# Patient Record
Sex: Female | Born: 1985 | Race: White | Hispanic: No | Marital: Single | Smoking: Never smoker
Health system: Southern US, Community
[De-identification: ages and names within clinical notes are randomized; demographics above are authoritative.]

## PROBLEM LIST (undated history)

## (undated) DIAGNOSIS — Q909 Down syndrome, unspecified: Secondary | ICD-10-CM

## (undated) DIAGNOSIS — R011 Cardiac murmur, unspecified: Secondary | ICD-10-CM

## (undated) DIAGNOSIS — J42 Unspecified chronic bronchitis: Secondary | ICD-10-CM

## (undated) DIAGNOSIS — Z9889 Other specified postprocedural states: Secondary | ICD-10-CM

## (undated) DIAGNOSIS — J982 Interstitial emphysema: Secondary | ICD-10-CM

## (undated) DIAGNOSIS — E8809 Other disorders of plasma-protein metabolism, not elsewhere classified: Secondary | ICD-10-CM

## (undated) DIAGNOSIS — J189 Pneumonia, unspecified organism: Secondary | ICD-10-CM

## (undated) DIAGNOSIS — Z8489 Family history of other specified conditions: Secondary | ICD-10-CM

## (undated) DIAGNOSIS — F449 Dissociative and conversion disorder, unspecified: Secondary | ICD-10-CM

## (undated) DIAGNOSIS — R112 Nausea with vomiting, unspecified: Secondary | ICD-10-CM

## (undated) DIAGNOSIS — E039 Hypothyroidism, unspecified: Secondary | ICD-10-CM

## (undated) DIAGNOSIS — I2699 Other pulmonary embolism without acute cor pulmonale: Secondary | ICD-10-CM

## (undated) HISTORY — PX: TOE FUSION: SHX1070

## (undated) HISTORY — PX: EYE MUSCLE SURGERY: SHX370

## (undated) HISTORY — PX: BUNIONECTOMY: SHX129

## (undated) HISTORY — PX: TONSILLECTOMY AND ADENOIDECTOMY: SUR1326

## (undated) HISTORY — PX: MYRINGOPLASTY: SUR873

---

## 2014-11-25 ENCOUNTER — Ambulatory Visit (INDEPENDENT_AMBULATORY_CARE_PROVIDER_SITE_OTHER): Payer: BLUE CROSS/BLUE SHIELD | Admitting: Sports Medicine

## 2014-11-25 ENCOUNTER — Encounter: Payer: Self-pay | Admitting: Sports Medicine

## 2014-11-25 VITALS — BP 114/73 | Ht 59.0 in | Wt 150.0 lb

## 2014-11-25 DIAGNOSIS — R269 Unspecified abnormalities of gait and mobility: Secondary | ICD-10-CM | POA: Diagnosis not present

## 2014-11-25 DIAGNOSIS — M25579 Pain in unspecified ankle and joints of unspecified foot: Secondary | ICD-10-CM | POA: Diagnosis not present

## 2014-11-25 NOTE — Progress Notes (Signed)
  Angela Odom - 29 y.o. female MRN [id number]  Date of birth: February 19, 1986  SUBJECTIVE: CC:  1. Office visit for Custom Orthotic Fabrication  HPI:   Multiple prior foot issues including bunion formation with fusion of the first MTP on the left.  Multiple inversion injuries.  Seen at [facility], referred for orthotics  ROS: - per HPI   HISTORY:  Past Medical, Surgical, Social, and Family History reviewed & updated per EMR.  Pertinent Historical Findings include: Social History   Occupational History  . Student [employer]   Social History Main Topics  . Smoking status: Never Smoker   . Smokeless tobacco: Not on file  . Alcohol Use: Not on file  . Drug Use: Not on file  . Sexual Activity: Not on file     OBJECTIVE:  VS:   HT:$Re'4\' 11"'ZTM$  (149.9 cm)   WT:150 lb (68.04 kg)  BMI:30.4          BP:114/73 mmHg  HR: bpm  TEMP: ( )  RESP:   PHYSICAL EXAM: GENERAL:  adult, Caucasian, Down syndrome. No acute distress  PSYCH: Alert and appropriately interactive.  SKIN: No open skin lesions or abnormal skin markings on areas inspected as below  VASCULAR:  DP and PT pulses 2+/4. No significant pretibial edema.   NEURO:  sensation is intact bilateral feet   BILATERAL FEET: Multiple postsurgical incisions. Left first MTP is fused. Second with prior bunion osteotomy, [place]. Pes planus. Calcaneal valgus. Splay toe. Dorsiflexion limited to 80 and 85 GAIT: Wide-based, antalgic, heel striker, external rotation bilateral feet.  ASSESSMENT & PLAN: See problem based charting & AVS for additional documentation Problem List Items Addressed This Visit    Abnormality of gait    PROCEDURE NOTE : CUSTOM ORTHOTICS The patient was fitted for a standard, cushioned, semi-rigid orthotic. The orthotic was heated & placed on the orthotic stand. The patient was positioned in subtalar neutral position and 10 of ankle dorsiflexion and weight bearing stance some heated orthotic blank. After completion of  the molding a stable paste was applied to the orthotic blank. The orthotic was ground to a stable position for weightbearing. The patient ambulated in these and reported they were comfortable without pressure spots.  Blank:  Size 6 - Standard Cushioned  Base:  Blue EVA  Postings:  small met pads and 1st ray post  >50% of this 45 minute visit spent in direct patient counseling and/or coordination of care.        Other Visit Diagnoses    Pain in joint, ankle and foot, unspecified laterality    -  Primary      FOLLOW UP:  Return if symptoms worsen or fail to improve.

## 2014-11-26 ENCOUNTER — Encounter: Payer: Self-pay | Admitting: Sports Medicine

## 2014-11-26 DIAGNOSIS — R269 Unspecified abnormalities of gait and mobility: Secondary | ICD-10-CM | POA: Insufficient documentation

## 2014-11-26 NOTE — Assessment & Plan Note (Signed)
PROCEDURE NOTE : CUSTOM ORTHOTICS The patient was fitted for a standard, cushioned, semi-rigid orthotic. The orthotic was heated & placed on the orthotic stand. The patient was positioned in subtalar neutral position and 10 of ankle dorsiflexion and weight bearing stance some heated orthotic blank. After completion of the molding a stable paste was applied to the orthotic blank. The orthotic was ground to a stable position for weightbearing. The patient ambulated in these and reported they were comfortable without pressure spots.  Blank:  Size 6 - Standard Cushioned  Base:  Blue EVA  Postings:  small met pads and 1st ray post  >50% of this 45 minute visit spent in direct patient counseling and/or coordination of care.

## 2016-01-22 DIAGNOSIS — I2699 Other pulmonary embolism without acute cor pulmonale: Secondary | ICD-10-CM

## 2016-01-22 HISTORY — DX: Other pulmonary embolism without acute cor pulmonale: I26.99

## 2016-03-17 ENCOUNTER — Encounter: Payer: Self-pay | Admitting: Oncology

## 2016-04-06 ENCOUNTER — Telehealth: Payer: Self-pay | Admitting: Oncology

## 2016-04-06 ENCOUNTER — Ambulatory Visit (HOSPITAL_BASED_OUTPATIENT_CLINIC_OR_DEPARTMENT_OTHER): Payer: Medicare Other | Admitting: Oncology

## 2016-04-06 ENCOUNTER — Encounter: Payer: Self-pay | Admitting: Oncology

## 2016-04-06 DIAGNOSIS — I2699 Other pulmonary embolism without acute cor pulmonale: Secondary | ICD-10-CM | POA: Diagnosis not present

## 2016-04-06 DIAGNOSIS — I749 Embolism and thrombosis of unspecified artery: Secondary | ICD-10-CM

## 2016-04-06 NOTE — Progress Notes (Signed)
Reason for Referral: Pulmonary embolism.   HPI: This 30 year old woman native of [address] [place] but currently attending school in [place]. She is rather healthy woman without any significant comorbid conditions that have had multiple surgeries on her feet. She was in her usual state of health in July 2017 when she started developing periodic cough and subsequently started developing chest pain or shortness of breath. At that time she had traveled to [place] with her family and leading up to that trip has been on oral contraceptives. She had a chest x-ray obtained at that time which showed atelectasis and possible pneumonia and a CT scan showed a pulmonary embolism. She was hospitalized at [facility] in [place]. She was started on full dose anticoagulation initially with heparin and subsequently with Eliquis. She has been on Eliquis since 02/20/2016. She did have a hypercoagulable workup although the results are not available to me. She did have a cardiolipin antibody titers were negative but other than that the rest of the results are not available. Clinically, she feels well at this time and reports no complaints. She denied any chest pain or difficulty breathing. She denied any shortness of breath. She denied any bleeding complaints. She denied recent thrombosis episodes after previous surgeries. She denies any family history of thrombosis or malignancies.  She does report occasional headaches but no blurry vision, syncope or seizures. She does not report any fevers, chills or sweats. She does not report any cough, wheezing or hemoptysis. She does not report any nausea, vomiting or abdominal pain. She does not report any frequency urgency or hesitancy. She does not report any skeletal complaints. She does not report any arthralgias or myalgias. Remaining review of systems unremarkable.   Past Medical History:  Diagnosis Date  . Embolism (HCC)    :  No past surgical history on file.:   Current Outpatient Prescriptions:  .  apixaban (ELIQUIS) 5 MG TABS tablet, Take 5 mg by mouth 2 (two) times daily., Disp: , Rfl:  .  [facility] LORATADINE-D 24 HOUR 10-240 MG per 24 hr tablet, Take 1 tablet by mouth daily as needed. , Disp: , Rfl: 1 .  fluticasone (FLONASE) 50 MCG/ACT nasal spray, Place 1 spray into both nostrils daily as needed. , Disp: , Rfl: :  Allergies  Allergen Reactions  . Ceclor [Cefaclor] Hives  . Sulfa Antibiotics Hives  :  No family history on file.:  Social History   Social History  . Marital status: Single    Spouse name: N/A  . Number of children: N/A  . Years of education: N/A   Occupational History  . Student [employer]   Social History Main Topics  . Smoking status: Never Smoker  . Smokeless tobacco: Not on file  . Alcohol use Not on file  . Drug use: Unknown  . Sexual activity: Not on file   Other Topics Concern  . Not on file   Social History Narrative   Freshman at [employer], originally from [place], was living in [place]  :  Pertinent items are noted in HPI.  Exam: Blood pressure (!) 135/53, pulse 69, temperature 98 F (36.7 C), temperature source Oral, resp. rate 18, height 4\' 11"  (1.499 m), weight 214 lb 14.4 oz (97.5 kg), SpO2 99 %.  ECOG 0 General appearance: alert and cooperative appeared without distress. Head: Normocephalic, without obvious abnormality Throat: lips, mucosa, and tongue normal; teeth and gums normal no oral ulcers or lesions. Neck: no adenopathy  Back: negative Resp: clear to auscultation bilaterally Chest wall: no tenderness Cardio: regular rate and rhythm, S1, S2 normal, no murmur, click, rub or gallop GI: soft, non-tender; bowel sounds normal; no masses,  no organomegaly Extremities: extremities normal, atraumatic, no cyanosis or edema     Assessment and Plan:    30 year old woman with the following issues:  1. Pulmonary embolism diagnosed in July 2017. This was  diagnosed in the setting of oral contraceptive and a long flight to [place]. She has been on Eliquis since that time and I have tolerated it well. She denied any history of thrombophilia or family history of blood disorder or clots.  The natural course of thrombosis was discussed and treatment strategies were also reviewed. Full dose anticoagulation is warranted at this time and the duration of anticoagulation is recommended to be 6 months given the extensive nature of her thrombosis. Although her thrombosis was provoked by oral contraceptives and travel.  Upon completing 6 months of therapy starting on 02/20/2016 she will come off anticoagulation and I will check a hypercoagulable profile on her off therapy. She has a lupus anticoagulant, discontinuation permanently of anticoagulation would be recommended.  In the meantime, I have recommended that she stays off oral contraceptives permanently.  2. Tubal ligation surgery planning: She inquired about having that surgery done at some point. I have recommended that that surgery will be delayed to at least January 2018 and preferably until March 2018 after she completed 6 months of anticoagulation.  3. Follow-up: Will be in March 2018 after obtaining hypercoagulable panel discussed with our recommendations.

## 2016-04-06 NOTE — Telephone Encounter (Signed)
Avs report and appointment schedule given to patient per 04/06/16 los.

## 2016-09-21 ENCOUNTER — Other Ambulatory Visit (HOSPITAL_BASED_OUTPATIENT_CLINIC_OR_DEPARTMENT_OTHER): Payer: Medicare Other

## 2016-09-21 DIAGNOSIS — I2699 Other pulmonary embolism without acute cor pulmonale: Secondary | ICD-10-CM

## 2016-09-21 DIAGNOSIS — I749 Embolism and thrombosis of unspecified artery: Secondary | ICD-10-CM

## 2016-09-22 LAB — LUPUS ANTICOAGULANT PANEL
DRVVT: 41 s (ref 0.0–47.0)
PTT-LA: 40.6 s (ref 0.0–51.9)

## 2016-09-25 LAB — FACTOR 5 LEIDEN

## 2016-09-28 ENCOUNTER — Ambulatory Visit (HOSPITAL_BASED_OUTPATIENT_CLINIC_OR_DEPARTMENT_OTHER): Payer: Medicare Other | Admitting: Oncology

## 2016-09-28 VITALS — BP 124/65 | HR 68 | Temp 97.8°F | Resp 18 | Ht 59.0 in | Wt 225.4 lb

## 2016-09-28 DIAGNOSIS — I749 Embolism and thrombosis of unspecified artery: Secondary | ICD-10-CM

## 2016-09-28 DIAGNOSIS — I2699 Other pulmonary embolism without acute cor pulmonale: Secondary | ICD-10-CM | POA: Diagnosis present

## 2016-09-28 NOTE — Progress Notes (Signed)
Hematology and Oncology Follow Up Visit  Angela Odom [id number] 04-14-1986 31 y.o. 09/28/2016 3:35 PM Pcp Not In SystemNo ref. provider found   Principle Diagnosis: 31 year old woman diagnosed with pulmonary embolism in July 2017. His was provoked by oral contraceptives and immobility after prolonged flight. Hypercoagulable workup was unrevealing.   Prior Therapy: Full dose anticoagulation with Eliquis for 6 months completed in February 2018.  Current therapy: Observation and surveillance.  Interim History: [name] presents today for a follow-up visit. Since the last visit, she reports no major changes in her health. She has been off Eliquis and have not had any issues since. She denied any thrombosis or bleeding episodes. She denied any recent hospitalizations or illnesses. She is off oral contraceptives and considering having tubal ligation.  She does not report any headaches, blurry vision, syncope or seizures. She does not report any fevers or chills or sweats. She does not report any cough, wheezing or hemoptysis. She is not report any nausea, vomiting or abdominal pain. She does not report any frequency urgency or hesitancy. She does not report skeletal complaints. Remaining review of systems unremarkable.  Medications: I have reviewed the patient's current medications.  Current Outpatient Prescriptions  Medication Sig Dispense Refill  . apixaban (ELIQUIS) 5 MG TABS tablet Take 5 mg by mouth 2 (two) times daily.    . [facility] LORATADINE-D 24 HOUR 10-240 MG per 24 hr tablet Take 1 tablet by mouth daily as needed.   1  . fluticasone (FLONASE) 50 MCG/ACT nasal spray Place 1 spray into both nostrils daily as needed.      No current facility-administered medications for this visit.      Allergies:  Allergies  Allergen Reactions  . Ceclor [Cefaclor] Hives  . Sulfa Antibiotics Hives    Past Medical History, Surgical history, Social history, and Family History were reviewed and  updated.   Physical Exam: Blood pressure 124/65, pulse 68, temperature 97.8 F (36.6 C), resp. rate 18, height 4\' 11"  (1.499 m), weight 225 lb 6.4 oz (102.2 kg), SpO2 99 %. ECOG: 0 General appearance: alert and cooperative Head: Normocephalic, without obvious abnormality Neck: no adenopathy Lymph nodes: Cervical, supraclavicular, and axillary nodes normal. Heart:regular rate and rhythm, S1, S2 normal, no murmur, click, rub or gallop Lung:chest clear, no wheezing, rales, normal symmetric air entry, Heart exam - S1, S2 normal, no murmur, no gallop, rate regular Abdomin: soft, non-tender, without masses or organomegaly EXT:no erythema, induration, or nodules    Ref Range & Units 7d ago  PTT-LA 0.0 - 51.9 sec 40.6   dRVVT 0.0 - 47.0 sec 41.0   Lupus Reflex Interpretation  Comment:   Comments: No lupus anticoagulant was detected.      Impression and Plan:  31 year old woman with the following issues:  1. Pulmonary embolism diagnosed in July 2017. This was diagnosed in the setting of oral contraceptive and a long flight to [place].   She completed 6 months of full dose anticoagulation in February of 2018. Her lupus anticoagulant panel did not show any abnormalities.  Risks and benefits of long-term anticoagulation with for a provoked first thrombosis episode beyond 6 months were reviewed. At this point it is reasonable to discontinue anticoagulation and only anticoagulate long-term only if she develops a recurrent thrombosis. We have discussed strategies to decrease her risk of developing another clot which includes increased mobilization and avoiding oral contraceptives.  2. Contraception: She is interested in obtaining tubal ligation and I encouraged her to do so. She is  low risk of developing any complications at this time.  Eli HoseSHADAD,Skyelar Swigart, MD 3/8/20183:35 PM

## 2016-11-01 ENCOUNTER — Emergency Department (HOSPITAL_COMMUNITY): Payer: BLUE CROSS/BLUE SHIELD

## 2016-11-01 ENCOUNTER — Observation Stay (HOSPITAL_COMMUNITY)
Admission: EM | Admit: 2016-11-01 | Discharge: 2016-11-02 | Disposition: A | Discharge status: Home / Self Care | Payer: BLUE CROSS/BLUE SHIELD | Attending: Family Medicine | Admitting: Family Medicine

## 2016-11-01 ENCOUNTER — Observation Stay (HOSPITAL_COMMUNITY): Payer: BLUE CROSS/BLUE SHIELD

## 2016-11-01 ENCOUNTER — Encounter (HOSPITAL_COMMUNITY): Payer: Self-pay

## 2016-11-01 DIAGNOSIS — Z79899 Other long term (current) drug therapy: Secondary | ICD-10-CM | POA: Diagnosis not present

## 2016-11-01 DIAGNOSIS — J982 Interstitial emphysema: Secondary | ICD-10-CM | POA: Diagnosis not present

## 2016-11-01 DIAGNOSIS — R931 Abnormal findings on diagnostic imaging of heart and coronary circulation: Secondary | ICD-10-CM | POA: Diagnosis not present

## 2016-11-01 DIAGNOSIS — R0602 Shortness of breath: Secondary | ICD-10-CM | POA: Diagnosis not present

## 2016-11-01 DIAGNOSIS — J181 Lobar pneumonia, unspecified organism: Secondary | ICD-10-CM | POA: Diagnosis not present

## 2016-11-01 DIAGNOSIS — R9389 Abnormal findings on diagnostic imaging of other specified body structures: Secondary | ICD-10-CM

## 2016-11-01 DIAGNOSIS — R05 Cough: Secondary | ICD-10-CM | POA: Diagnosis present

## 2016-11-01 DIAGNOSIS — J189 Pneumonia, unspecified organism: Secondary | ICD-10-CM

## 2016-11-01 DIAGNOSIS — R1012 Left upper quadrant pain: Secondary | ICD-10-CM | POA: Diagnosis not present

## 2016-11-01 HISTORY — DX: Family history of other specified conditions: Z84.89

## 2016-11-01 HISTORY — DX: Other disorders of plasma-protein metabolism, not elsewhere classified: E88.09

## 2016-11-01 HISTORY — DX: Nausea with vomiting, unspecified: R11.2

## 2016-11-01 HISTORY — DX: Down syndrome, unspecified: Q90.9

## 2016-11-01 HISTORY — DX: Interstitial emphysema: J98.2

## 2016-11-01 HISTORY — DX: Pneumonia, unspecified organism: J18.9

## 2016-11-01 HISTORY — DX: Dissociative and conversion disorder, unspecified: F44.9

## 2016-11-01 HISTORY — DX: Other pulmonary embolism without acute cor pulmonale: I26.99

## 2016-11-01 HISTORY — DX: Unspecified chronic bronchitis: J42

## 2016-11-01 HISTORY — DX: Other specified postprocedural states: Z98.890

## 2016-11-01 LAB — CBC WITH DIFFERENTIAL/PLATELET
Basophils Absolute: 0 10*3/uL (ref 0.0–0.1)
Basophils Relative: 1 %
Eosinophils Absolute: 0.3 10*3/uL (ref 0.0–0.7)
Eosinophils Relative: 6 %
HCT: 37.2 % (ref 36.0–46.0)
Hemoglobin: 12.7 g/dL (ref 12.0–15.0)
Lymphocytes Relative: 27 %
Lymphs Abs: 1.1 10*3/uL (ref 0.7–4.0)
MCH: 31.3 pg (ref 26.0–34.0)
MCHC: 34.1 g/dL (ref 30.0–36.0)
MCV: 91.6 fL (ref 78.0–100.0)
Monocytes Absolute: 0.3 10*3/uL (ref 0.1–1.0)
Monocytes Relative: 8 %
Neutro Abs: 2.5 10*3/uL (ref 1.7–7.7)
Neutrophils Relative %: 58 %
Platelets: 338 10*3/uL (ref 150–400)
RBC: 4.06 MIL/uL (ref 3.87–5.11)
RDW: 13.3 % (ref 11.5–15.5)
WBC: 4.3 10*3/uL (ref 4.0–10.5)

## 2016-11-01 LAB — URINALYSIS, ROUTINE W REFLEX MICROSCOPIC
BILIRUBIN URINE: NEGATIVE
Glucose, UA: NEGATIVE mg/dL
Hgb urine dipstick: NEGATIVE
Ketones, ur: NEGATIVE mg/dL
Leukocytes, UA: NEGATIVE
Nitrite: NEGATIVE
Protein, ur: NEGATIVE mg/dL
SPECIFIC GRAVITY, URINE: 1.006 (ref 1.005–1.030)
pH: 5 (ref 5.0–8.0)

## 2016-11-01 LAB — COMPREHENSIVE METABOLIC PANEL
ALT: 17 U/L (ref 14–54)
ANION GAP: 13 (ref 5–15)
AST: 26 U/L (ref 15–41)
Albumin: 3.4 g/dL — ABNORMAL LOW (ref 3.5–5.0)
Alkaline Phosphatase: 58 U/L (ref 38–126)
BILIRUBIN TOTAL: 0.5 mg/dL (ref 0.3–1.2)
BUN: 14 mg/dL (ref 6–20)
CO2: 22 mmol/L (ref 22–32)
Calcium: 9 mg/dL (ref 8.9–10.3)
Chloride: 102 mmol/L (ref 101–111)
Creatinine, Ser: 0.93 mg/dL (ref 0.44–1.00)
Glucose, Bld: 95 mg/dL (ref 65–99)
POTASSIUM: 3.6 mmol/L (ref 3.5–5.1)
Sodium: 137 mmol/L (ref 135–145)
TOTAL PROTEIN: 6.7 g/dL (ref 6.5–8.1)

## 2016-11-01 LAB — TROPONIN I

## 2016-11-01 LAB — PREGNANCY, URINE: Preg Test, Ur: NEGATIVE

## 2016-11-01 LAB — TSH: TSH: 5.823 u[IU]/mL — ABNORMAL HIGH (ref 0.350–4.500)

## 2016-11-01 MED ORDER — HYDROMORPHONE HCL 1 MG/ML IJ SOLN: 0.5000 mg | Freq: Once | INTRAMUSCULAR | Status: DC

## 2016-11-01 MED ORDER — IOPAMIDOL (ISOVUE-370) INJECTION 76%
INTRAVENOUS | Status: AC
  Administered 2016-11-01: 60 mL via INTRAVENOUS
  Filled 2016-11-01: qty 100

## 2016-11-01 MED ORDER — BENZONATATE 100 MG PO CAPS
200.0000 mg | ORAL_CAPSULE | Freq: Two times a day (BID) | ORAL | Status: DC
  Administered 2016-11-01 – 2016-11-02 (×2): 200 mg via ORAL
  Filled 2016-11-01 (×2): qty 2

## 2016-11-01 MED ORDER — ACETAMINOPHEN 650 MG RE SUPP: 650.0000 mg | Freq: Four times a day (QID) | RECTAL | Status: DC | PRN

## 2016-11-01 MED ORDER — IOPAMIDOL (ISOVUE-300) INJECTION 61%: 150.0000 mL | Freq: Once | INTRAVENOUS | Status: DC | PRN

## 2016-11-01 MED ORDER — LEVOFLOXACIN IN D5W 750 MG/150ML IV SOLN
750.0000 mg | INTRAVENOUS | Status: DC
  Administered 2016-11-01: 750 mg via INTRAVENOUS
  Filled 2016-11-01 (×2): qty 150

## 2016-11-01 MED ORDER — ENOXAPARIN SODIUM 40 MG/0.4ML ~~LOC~~ SOLN
40.0000 mg | SUBCUTANEOUS | Status: DC
  Administered 2016-11-01: 40 mg via SUBCUTANEOUS
  Filled 2016-11-01 (×2): qty 0.4

## 2016-11-01 MED ORDER — FLUTICASONE PROPIONATE 50 MCG/ACT NA SUSP
1.0000 | Freq: Every day | NASAL | Status: DC
  Administered 2016-11-01 – 2016-11-02 (×2): 1 via NASAL
  Filled 2016-11-01: qty 16

## 2016-11-01 MED ORDER — IPRATROPIUM-ALBUTEROL 0.5-2.5 (3) MG/3ML IN SOLN: 3.0000 mL | RESPIRATORY_TRACT | Status: DC | PRN

## 2016-11-01 MED ORDER — PERMETHRIN 1 % EX LOTN
TOPICAL_LOTION | Freq: Once | CUTANEOUS | Status: AC
  Administered 2016-11-01: 22:00:00 via TOPICAL
  Filled 2016-11-01: qty 59

## 2016-11-01 MED ORDER — OXYCODONE-ACETAMINOPHEN 5-325 MG PO TABS
1.0000 | ORAL_TABLET | Freq: Once | ORAL | Status: AC
  Administered 2016-11-01: 1 via ORAL
  Filled 2016-11-01: qty 1

## 2016-11-01 MED ORDER — SODIUM CHLORIDE 0.45 % IV SOLN
INTRAVENOUS | Status: DC
  Administered 2016-11-01 – 2016-11-02 (×2): via INTRAVENOUS

## 2016-11-01 MED ORDER — IOPAMIDOL (ISOVUE-300) INJECTION 61%
INTRAVENOUS | Status: AC
  Filled 2016-11-01: qty 150

## 2016-11-01 MED ORDER — ACETAMINOPHEN 325 MG PO TABS: 650.0000 mg | ORAL_TABLET | Freq: Four times a day (QID) | ORAL | Status: DC | PRN

## 2016-11-01 NOTE — ED Provider Notes (Signed)
Patient signed out to me at shift change.  Patient pending CT PE study.  10:13 AM CT shows NO PE, but does show pneumonia and pneumomediastinum.  Discussed with Dr. Particia Nearing, who recommends admission.  Patient has not had any vomiting.  Pneumomediastinum not thought to be 2/2 esophageal injury at this time.  Discussed case with family medicine teaching service, who asks that I consult CT surgery.  10:15 AM Spoke with Consulting civil engineer with CT surgery, who will review case with Dr. Tyrone Sage, who should call back after finishing current case.  1:34 PM Spoke with Revonda Standard from CT surgery, states that surgeon will evaluate patient and provide recommendations in consult note.    Family medicine has seen and admitted patient.   Roxy Horseman, PA-C 11/01/16 1335    Jacalyn Lefevre, MD 11/01/16 [phone]

## 2016-11-01 NOTE — ED Notes (Addendum)
Cardiothoracic surgery at bedside

## 2016-11-01 NOTE — ED Notes (Signed)
Esophagus study canceled due to misreading of CT.

## 2016-11-01 NOTE — ED Provider Notes (Signed)
[facility]-EMERGENCY DEPT Provider Note   CSN: [id number] Arrival date & time: 11/01/16  0056     History   Chief Complaint Chief Complaint  Patient presents with  . Flank Pain    HPI Angela Odom is a 31 y.o. female.  Patient with history of PE diagnosed July, 2017, after at risk travel and oral contraceptive use, anticoagulated for 6 months, followed by hem/onc (Dr. Clelia Croft), which ended last month. No further oral contraceptive use. Per record a hypercoagulable study was done that was unremarkable. She presents today with ongoing cough for several weeks and a new discomfort in the left upper and lateral abdomen since yesterday. The pain is worse with breathing deeply or coughing. No known fever. No hemoptysis. She has no nausea or vomiting.   The history is provided by the patient and a parent. No language interpreter was used.    Past Medical History:  Diagnosis Date  . Embolism [facility])     Patient Active Problem List   Diagnosis Date Noted  . Embolism (HCC) 04/06/2016  . Abnormality of gait 11/26/2014    History reviewed. No pertinent surgical history.  OB History    No data available       Home Medications    Prior to Admission medications   Medication Sig Start Date End Date Taking? Authorizing Provider  fluticasone (FLONASE) 50 MCG/ACT nasal spray Place 1 spray into both nostrils daily.  11/16/14  Yes Historical Provider, MD    Family History No family history on file.  Social History Social History  Substance Use Topics  . Smoking status: Never Smoker  . Smokeless tobacco: Not on file  . Alcohol use Not on file     Allergies   Ceclor [cefaclor] and Sulfa antibiotics   Review of Systems Review of Systems  Constitutional: Negative for chills and fever.  HENT: Negative.   Respiratory: Positive for cough and shortness of breath.   Cardiovascular: Negative.   Gastrointestinal: Positive for abdominal pain. Negative for nausea.  Musculoskeletal:  Negative.  Negative for myalgias.  Skin: Negative.  Negative for rash.  Neurological: Negative.  Negative for headaches.     Physical Exam Updated Vital Signs BP 140/69   Pulse 65   Temp 98.5 F (36.9 C) (Oral)   Resp 15   LMP 10/08/2016   SpO2 93%   Physical Exam  Constitutional: She appears well-developed and well-nourished.  HENT:  Head: Normocephalic.  Neck: Normal range of motion. Neck supple.  Cardiovascular: Normal rate and regular rhythm.   No murmur heard. Pulmonary/Chest: Effort normal and breath sounds normal. She has no wheezes. She has no rales.  Abdominal: Soft. Bowel sounds are normal. There is tenderness. There is no rebound and no guarding.  Musculoskeletal: Normal range of motion.  Neurological: She is alert.  Skin: Skin is warm and dry. No rash noted.  Psychiatric: She has a normal mood and affect.     ED Treatments / Results  Labs (all labs ordered are listed, but only abnormal results are displayed) Labs Reviewed  URINALYSIS, ROUTINE W REFLEX MICROSCOPIC - Abnormal; Notable for the following:       Result Value   Color, Urine STRAW (*)    All other components within normal limits  COMPREHENSIVE METABOLIC PANEL - Abnormal; Notable for the following:    Albumin 3.4 (*)    All other components within normal limits  PREGNANCY, URINE  CBC WITH DIFFERENTIAL/PLATELET  POC URINE PREG, ED    EKG  EKG  Interpretation None       Radiology No results found.  Procedures Procedures (including critical care time)  Medications Ordered in ED Medications  HYDROmorphone (DILAUDID) injection 0.5 mg (not administered)  oxyCODONE-acetaminophen (PERCOCET/ROXICET) 5-325 MG per tablet 1 tablet (not administered)  iopamidol (ISOVUE-370) 76 % injection (100 mLs  Contrast Given 11/01/16 4098)     Initial Impression / Assessment and Plan / ED Course  I have reviewed the triage vital signs and the nursing notes.  Pertinent labs & imaging results that were  available during my care of the patient were reviewed by me and considered in my medical decision making (see chart for details).     Patient with a history of PE here with LUQ abdominal pain, pleuritic pain, cough. Patient too high risk for evaluation with d-dimer. CT angio delayed due to difficult IV access.   Patient care signed out to Angela Odom , PA-C, for review of CT angio and disposition. Patient stable and comfortable.   Final Clinical Impressions(s) / ED Diagnoses   Final diagnoses:  None   1. LUQ abdominal pain 2. Persistent cough 3. SOB 4. History of PE  New Prescriptions New Prescriptions   No medications on file     Elpidio Anis, Cordelia Poche 11/15/16 2026    Shon Baton, MD 11/27/16 [phone]

## 2016-11-01 NOTE — ED Notes (Signed)
Patient transported to X-ray on monitor at this time.

## 2016-11-01 NOTE — H&P (Signed)
Family Medicine Teaching [facility] Admission History and Physical Service Pager: [phone]  Patient name: Angela Odom Medical record number: [id number] Date of birth: Jan 16, 1986 Age: 31 y.o. Gender: female  Primary Care Provider: Pcp Not In System Consultants: CT Surgery  Code Status: Full   Chief Complaint: left sided chest pain  Assessment and Plan: Angela Odom is a 31 y.o. female presenting with left side chest pain. PMH is significant for down syndrome, hx of PE, Seasonal Allergies   CAP and Uncomplicated Pneumomediastium: CBC and BMET within normal limits. Vitals are stable, no fevers noted. However due to hx of provoked PE in July 2017, patient had CTA. CTA noted for areas of consolidation in the right lung field and a small amount of air in the middle mediastinum, no PE. Therefore likely with community-acquired pneumonia and uncomplicated pneumomediastum possibly due to coughing  - Will admit for observation attending Dr. Randolm Idol on telemetry  - Provide Duonebs q4h  and tessalon Perles BID to try to prevent cough/valsalva which would  worsen pneumomediastinum  - CT surgery consulted - Continue levaquin IV - consider transition tomorrow to orals  - Follow up with 2 view CXR tomorrow to watch for progression of pneumomediastinum - Follow pulse oximetry and RR careful - watch for complication of pneumothorax  - AM labs   Left-sided chest pain:  Presenting with left-sided chest pain which is reproducible upon exam, given her history of cough and congestion likely musculoskeletal. Received diluadid and percocet in ED. No hx of heart disease unlikely cardiac, could consider pericarditis with viral illness, however given reproducibility of pain likely musculoskeletal  - Will continue pain management with tylenol  - Consider adding ibuprofen if not well controlled with tylenol  - Will obtain baseline EKG   Uncomplicated pneumomediastinum: No concern for Boerhaave syndrome or  Mallory-Weiss tear given patient's history, this is likely from small alvelolar rupture due cough, though apparently more likely in patient with a history of pulmonary disease - could consider recent hx of bilateral PEs and current pneumonia as inciting event. Unable to find any significant link to down syndrome. Negative Hamman's sign.  - CT surgery consulted  - 2 view CXR tomorrow morning  - If any  EKG findings consider ECHO as rarely can be associated with a tension pneumopericardium  Possibly Pneumoperitoneum  CT with possibly air noted in porta hepatis. No abdominal pain complaints. Benign examination. Obtained abdominal xray indicating more consistent with fat rather than air   Hx of PE: History of pulmonary embolism in July 2017 provoked by oral contraceptive and immobility after prolonged flight. Patient was seen by hematology and oncology with negative hypercoagulable workup. Completed full course of this for 6 months on February 2018  Down Syndrome: No likely association with current admission. Patient followed by gynecology, has no primary care physician here in [place]. Possibly be a candidate for our clinic. - obtained TSH and Hemaglobin A1C  Seasonal Allergies  - continue flonase   FEN/GI: Regular Diet  Prophylaxis: Lovenox   Disposition: observation  History of Present Illness:  Angela Odom is a 31 y.o. female presenting with left sided chest pain. Patient lives in  [place] as she is in a Pharmacist, hospital at [employer]. Her parents stay in [place]. Mother states that for the past 2 weeks she's been complaining of cough and congestion. However patient has a history of seasonal allergies and therefore mother told her to continue taking her Flonase and Mucinex as needed. She states that they went  to see her last night and noted her voice was slightly hoarse. They wanted her to be evaluated tomorrow morning by urgent care, however patient called last night 11:30 stating  she had  left-sided chest pain. Therefore, they decided to bring her to the ED for evaluation. She denies any history of fever or chills, pain anywhere else. She states the pain started last night and she did not have it previously. She indicates this pain is reproducible when she touches it. Patient has no history of cardiac issues per mother. She indicates that she had echoes when she was younger which were normal. She does note that they have not necessarily been up-to-date on all of her screening exams as she has been mainly seen by gynecology as not have a PCP in [place]. She does not have a history of asthma or other lung diseases. However mother did indicatesthat in the past she had several bouts of pneumonia and bronchitis. Mother does have a history of asthma herself.   Due to patient's history of provoked PE, ED obtained a CTA which noted to infiltrate on medial segmental right middle lobe and posterior segment of the right lower lobe consistent with pneumonia. There was also a small amount of air noted in the middle mediastinum which appears to separate the trachea and esophagus and finally it was noted that patient had a small amount of air in the porta hepatis region.   Review Of Systems: Per HPI with the following additions:   Review of Systems  Constitutional: Negative for diaphoresis and fever.  HENT: Positive for congestion. Negative for sinus pain and sore throat.   Eyes: Negative.   Respiratory: Positive for cough and shortness of breath. Negative for wheezing.   Cardiovascular: Positive for chest pain. Negative for palpitations and leg swelling.  Gastrointestinal: Negative for nausea and vomiting.  Genitourinary: Negative for dysuria and flank pain.  Neurological: Negative for headaches.    Patient Active Problem List   Diagnosis Date Noted  . Embolism (HCC) 04/06/2016  . Abnormality of gait 11/26/2014    Past Medical History: Past Medical History:  Diagnosis Date  .  Embolism [facility])     Past Surgical History: History reviewed. No pertinent surgical history.  Social History: Social History  Substance Use Topics  . Smoking status: Never Smoker  . Smokeless tobacco: Not on file  . Alcohol use Not on file   Additional social history: Goes to [employer], Family lives in [place]  Please also refer to relevant sections of EMR.  Family History: No family history on file. Mother with hx of Asthma, no hx of other lung disease   Allergies and Medications: Allergies  Allergen Reactions  . Ceclor [Cefaclor] Hives  . Sulfa Antibiotics Hives   No current facility-administered medications on file prior to encounter.    Current Outpatient Prescriptions on File Prior to Encounter  Medication Sig Dispense Refill  . fluticasone (FLONASE) 50 MCG/ACT nasal spray Place 1 spray into both nostrils daily.       Objective: BP 121/63   Pulse 72   Temp 98.5 F (36.9 C) (Oral)   Resp 14   LMP 10/08/2016   SpO2 96%  Exam: General: well-appearing, obese, pleasant female with syndromic features  Eyes: PERRLA, ENTM: Dry mucosa membranes, no tonsillar swelling  Neck: negative for crepitus or lymphadenopathy  Cardiovascular: RRR, no murmurs  Respiratory: CTAB, no wheezing, no crackles  Gastrointestinal: BS+, no ttp, no distention  MSK: Pain on palpation of left lower  rib airway, no lower extremity edema  Derm: no rashes or bruising  Neuro: 5/5 strength in upper and lower extremities  Psych: appropriate mood and congruent affect   Labs and Imaging: CBC BMET   Recent Labs Lab 11/01/16 0621  WBC 4.3  HGB 12.7  HCT 37.2  PLT 338    Recent Labs Lab 11/01/16 0621  NA 137  K 3.6  CL 102  CO2 22  BUN 14  CREATININE 0.93  GLUCOSE 95  CALCIUM 9.0     Ct Angio Chest Pe W/cm &/or Wo Cm  Result Date: 11/01/2016 CLINICAL DATA:  Chest pain EXAM: CT ANGIOGRAPHY CHEST WITH CONTRAST TECHNIQUE: Multidetector CT imaging of the chest was performed using  the standard protocol during bolus administration of intravenous contrast. Multiplanar CT image reconstructions and MIPs were obtained to evaluate the vascular anatomy. CONTRAST:  60 mL Isovue 370 nonionic COMPARISON:  None. FINDINGS: Cardiovascular: There is no demonstrable pulmonary embolus. There is no thoracic aortic aneurysm or dissection. Visualized great vessels appear normal. There is no appreciable aortic calcification. No pericardial thickening. Mediastinum/Nodes: Thyroid appears unremarkable. There is no appreciable thoracic adenopathy. There are scattered foci of air in the middle mediastinum which appears separate from the esophagus. There is no associated inflammatory change in these areas. No other mediastinal air is seen. Lungs/Pleura: There are areas of patchy airspace consolidation in the medial segment of the right middle lobe and in the posterior segment of the right lower lobe. Lungs elsewhere are clear. No pleural effusion or pleural thickening. No pneumothorax. Upper Abdomen: On the lower most images, there is a questionable small amount of air in the porta hepatis region. Visualized upper abdominal structures otherwise appear unremarkable. Musculoskeletal: No bony lesions are evident. Review of the MIP images confirms the above findings. IMPRESSION: No demonstrable pulmonary embolus. Patchy infiltrate in the medial segment right middle lobe and posterior segment right lower lobe, likely early pneumonia in these areas. Lungs elsewhere clear. There is a small amount of air in the middle mediastinum which appears separate from the trachea and esophagus. Question small amount of pneumomediastinum of uncertain etiology. If there is any clinical suspicion for esophageal injury, contrast swallow may be indicated to further evaluate. On the inferior-most images, there appears to be a small amount of air in the porta hepatis region. This finding may warrant dedicated CT abdomen study to further  assess. No adenopathy.  No aortic dissection. Electronically Signed   By: Bretta Bang III M.D.   On: 11/01/2016 09:10   Dg Abd 2 Views  Result Date: 11/01/2016 CLINICAL DATA:  Abnormal chest CT EXAM: ABDOMEN - 2 VIEW COMPARISON:  11/01/2016 FINDINGS: Contrast in the renal collecting system from earlier chest CT. Urinary bladder normal. No hydronephrosis Normal bowel gas pattern. No free air identified on the upright view. No abnormal calcifications. No bony abnormality. IMPRESSION: Negative. No evidence of free air. Low-density in the porta hepatis on earlier chest CT appears to represent fat rather than gas. Electronically Signed   By: Marlan Palau M.D.   On: 11/01/2016 11:42    Fortune Brannigan Mayra Reel, MD 11/01/2016, 10:42 AM PGY-2, [place] [facility] FPTS Intern pager: [phone], text pages welcome

## 2016-11-01 NOTE — Progress Notes (Signed)
Documentation:   Late Entry:   Nursing paged to report that mother noticed that patient may have head lice.  Went to evaluate patient. Reports of itchy scalp. On exam, nits seen. Will order permethrin. Attempted to contact infection prevention to discuss proper precautions, went to voicemail. Currently on contact precautions with hair nets, shoe covers, and gloves.   Palma Holter, MD PGY 2 Family Medicine

## 2016-11-01 NOTE — ED Notes (Signed)
Got a call from CT stating pt IV blew and was not tolerating contrast well. This RN went to CT to attempt another [place] IV and was unsuccessful. Pt mother sts she would like to just start w/ a CXray due to pt being in so much pain from IV starts. EDP Sherri made aware.

## 2016-11-01 NOTE — ED Notes (Signed)
Pt ambulated to restroom from room. Tolerated well.

## 2016-11-01 NOTE — Consult Note (Signed)
[address]       [place] [address]             [phone]        Angela Odom [facility] Medical Record #[id number] Date of Birth: 09-04-1985  Referring: Dr. Particia Nearing (ED Physician) Primary Care: Pcp Not In System  Chief Complaint:    Chief Complaint  Patient presents with  . Flank Pain    History of Present Illness:       Angela Odom is a 31 yo white obese female with Down Syndrome.  She presented to the ED with complaints of left sided chest pain.  She has had a dry cough over the past several days that has been severe.  She denied fever, nausea/vomiting, and shortness of breath.  She has a history of PE in the past.  CTA of the chest was obtained and showed a questionable pneumomediastinum in the middle of the mediastinum.  There was also evidence of early pneumonia.  TCTS consult was requested.  She currently has no complaints.  She states she only experiences chest pain with deep breaths.  She has had a productive cough which she states was like nasal drainage.  She states the cough has been severe and she also has mild loss of voice.     Current Activity/ Functional Status: Patient is not independent with mobility/ambulation, transfers, ADL's, IADL's.    Past Medical History:  Diagnosis Date  . Embolism [facility])     History reviewed. No pertinent surgical history.  History  Smoking Status  . Never Smoker  Smokeless Tobacco  . Not on file   History  Alcohol use Not on file    Social History   Social History  . Marital status: Single    Spouse name: N/A  . Number of children: N/A  . Years of education: N/A   Occupational History  . Student [employer]   Social History Main Topics  . Smoking status: Never Smoker  . Smokeless tobacco: Not on file  . Alcohol use Not on file  . Drug use: Unknown  . Sexual activity: Not on file   Other Topics Concern  . Not on file   Social History Narrative   Freshman at [employer], originally from [place],  was living in [place]    Allergies  Allergen Reactions  . Ceclor [Cefaclor] Hives  . Sulfa Antibiotics Hives    Current Facility-Administered Medications  Medication Dose Route Frequency Provider Last Rate Last Dose  . 0.45 % sodium chloride infusion   Intravenous Continuous Angela Gosch R Amberley Hamler, PA-C      . acetaminophen (TYLENOL) tablet 650 mg  650 mg Oral Q6H PRN Angela Mayra Reel, MD       Or  . acetaminophen (TYLENOL) suppository 650 mg  650 mg Rectal Q6H PRN Angela Mayra Reel, MD      . benzonatate (TESSALON) capsule 200 mg  200 mg Oral BID Angela Mayra Reel, MD      . enoxaparin (LOVENOX) injection 40 mg  40 mg Subcutaneous Q24H Angela Mayra Reel, MD      . fluticasone (FLONASE) 50 MCG/ACT nasal spray 1 spray  1 spray Each Nare Daily Angela Mayra Reel, MD      . ipratropium-albuterol (DUONEB) 0.5-2.5 (3) MG/3ML nebulizer solution 3 mL  3 mL Nebulization Q4H PRN Angela Mayra Reel, MD      . levofloxacin (LEVAQUIN) IVPB 750 mg  750 mg Intravenous Q24H Angela Horseman, PA-C  100 mL/hr at 11/01/16 1205 750 mg at 11/01/16 1205   Current Outpatient Prescriptions  Medication Sig Dispense Refill  . fluticasone (FLONASE) 50 MCG/ACT nasal spray Place 1 spray into both nostrils daily.       (Not in a hospital admission)  No family history on file.  Review of Systems:  Constitutional: negative for chills and fevers Eyes: positive for multiple eye surgeries as a child for muscle weakness Respiratory: positive for productive cough, denies shortness of breath Cardiovascular: negative for chest pressure with deep inspiration.... Denies history of ASD/VSD Gastrointestinal: negative for nausea and vomiting Neurological: negative     Cardiac Review of Systems: Y or N  Chest Pain [    ]  Resting SOB [   ] Exertional SOB  [  ]  Orthopnea [  ]   Pedal Edema [   ]    Palpitations [  ] Syncope  [  ]   Presyncope [   ]  General Review of Systems: [Y] = yes [  ]=no Constitional: recent  weight change [  ]; anorexia [  ]; fatigue [  ]; nausea [n  ]; night sweats [  ]; fever [n  ]; or chills [  ]                                                               Dental: poor dentition[  ]; Last Dentist visit:   Eye : blurred vision [  ]; diplopia [   ]; vision changes [  ];  Amaurosis fugax[  ]; Resp: cough [place].Etienne  ];  wheezing[  ];  hemoptysis[  ]; shortness of breath[  ]; paroxysmal nocturnal dyspnea[  ]; dyspnea on exertion[  ]; or orthopnea[  ];  GI:  gallstones[  ], vomiting[n  ];  dysphagia[  ]; melena[  ];  hematochezia [  ]; heartburn[  ];   Hx of  Colonoscopy[  ]; GU: kidney stones [  ]; hematuria[  ];   dysuria [  ];  nocturia[  ];  history of     obstruction [  ]; urinary frequency [  ]             Skin: rash, swelling[  ];, hair loss[  ];  peripheral edema[  ];  or itching[  ]; Musculosketetal: myalgias[  ];  joint swelling[  ];  joint erythema[  ];  joint pain[  ];  back pain[  ];  Heme/Lymph: bruising[  ];  bleeding[  ];  anemia[  ];  Neuro: TIA[  ];  headaches[  ];  stroke[  ];  vertigo[  ];  seizures[  ];   paresthesias[  ];  difficulty walking[  ];  Psych:depression[  ]; anxiety[  ];  Endocrine: diabetes[ n ];  thyroid dysfunction[n  ];  Immunizations: Flu [  ]; Pneumococcal[  ];  Other:  Physical Exam: BP 111/76   Pulse 84   Temp 98.5 F (36.9 C) (Oral)   Resp (!) 22   LMP 10/08/2016   SpO2 98%   General appearance: alert, cooperative, no distress and + Down Syndrome Head: Normocephalic, without obvious abnormality, atraumatic Resp: clear to auscultation bilaterally Cardio: regular rate and rhythm GI: soft, non-tender; bowel sounds normal; no masses,  no organomegaly Extremities: extremities normal, atraumatic, no cyanosis or edema Neurologic: Grossly normal  Diagnostic Studies & Laboratory data:     Recent Radiology Findings:   Ct Angio Chest Pe W/cm &/or Wo Cm  Result Date: 11/01/2016 CLINICAL DATA:  Chest pain EXAM: CT ANGIOGRAPHY CHEST WITH CONTRAST  TECHNIQUE: Multidetector CT imaging of the chest was performed using the standard protocol during bolus administration of intravenous contrast. Multiplanar CT image reconstructions and MIPs were obtained to evaluate the vascular anatomy. CONTRAST:  60 mL Isovue 370 nonionic COMPARISON:  None. FINDINGS: Cardiovascular: There is no demonstrable pulmonary embolus. There is no thoracic aortic aneurysm or dissection. Visualized great vessels appear normal. There is no appreciable aortic calcification. No pericardial thickening. Mediastinum/Nodes: Thyroid appears unremarkable. There is no appreciable thoracic adenopathy. There are scattered foci of air in the middle mediastinum which appears separate from the esophagus. There is no associated inflammatory change in these areas. No other mediastinal air is seen. Lungs/Pleura: There are areas of patchy airspace consolidation in the medial segment of the right middle lobe and in the posterior segment of the right lower lobe. Lungs elsewhere are clear. No pleural effusion or pleural thickening. No pneumothorax. Upper Abdomen: On the lower most images, there is a questionable small amount of air in the porta hepatis region. Visualized upper abdominal structures otherwise appear unremarkable. Musculoskeletal: No bony lesions are evident. Review of the MIP images confirms the above findings. IMPRESSION: No demonstrable pulmonary embolus. Patchy infiltrate in the medial segment right middle lobe and posterior segment right lower lobe, likely early pneumonia in these areas. Lungs elsewhere clear. There is a small amount of air in the middle mediastinum which appears separate from the trachea and esophagus. Question small amount of pneumomediastinum of uncertain etiology. If there is any clinical suspicion for esophageal injury, contrast swallow may be indicated to further evaluate. On the inferior-most images, there appears to be a small amount of air in the porta hepatis region.  This finding may warrant dedicated CT abdomen study to further assess. No adenopathy.  No aortic dissection. Electronically Signed   By: Bretta Bang III M.D.   On: 11/01/2016 09:10   Dg Abd 2 Views  Result Date: 11/01/2016 CLINICAL DATA:  Abnormal chest CT EXAM: ABDOMEN - 2 VIEW COMPARISON:  11/01/2016 FINDINGS: Contrast in the renal collecting system from earlier chest CT. Urinary bladder normal. No hydronephrosis Normal bowel gas pattern. No free air identified on the upright view. No abnormal calcifications. No bony abnormality. IMPRESSION: Negative. No evidence of free air. Low-density in the porta hepatis on earlier chest CT appears to represent fat rather than gas. Electronically Signed   By: Marlan Palau M.D.   On: 11/01/2016 11:42     I have independently reviewed the above radiologic studies.  Recent Lab Findings: Lab Results  Component Value Date   WBC 4.3 11/01/2016   HGB 12.7 11/01/2016   HCT 37.2 11/01/2016   PLT 338 11/01/2016   GLUCOSE 95 11/01/2016   ALT 17 11/01/2016   AST 26 11/01/2016   NA 137 11/01/2016   K 3.6 11/01/2016   CL 102 11/01/2016   CREATININE 0.93 11/01/2016   BUN 14 11/01/2016   CO2 22 11/01/2016   TSH 5.823 (H) 11/01/2016      Assessment / Plan:      1. Possible Pneumomediastinum-addendum reviewed Radiology has stated no pneumomediastinum is present no further workup needed 2. Dispo- will sign off  I  spent 40 minutes counseling the patient  face to face and 50% or more the  time was spent in counseling and coordination of care. The total time spent in the appointment was 55 minutes.    Denny Peon Victoriah Wilds PA-C 11/01/2016 2:32 PM

## 2016-11-01 NOTE — Progress Notes (Signed)
Documentation:   Went to evaluate Angela Odom this evening. She reports she is doing well overall. Still has intermittent chest wall pain under her left breast that is stable. Shortness of breath is stable. No pain with swallowing.   Spoke with radiology earlier this evening, who reported that CT was misread and that upon review, there is no evidence of pneumomediastinum. Radiology discussed with CT surgery and esophagram was cancelled.   Exam: GEN: NAD CV: RRR, no murmurs, rubs, or gallops. Chest wall tenderness to palpation under left breast.  PULM: crackles in the right lung base, normal effort NEURO: Awake, alert, no focal deficits grossly, normal speech  Palma Holter, MD PGY 2 Family Medicine

## 2016-11-02 DIAGNOSIS — J982 Interstitial emphysema: Secondary | ICD-10-CM | POA: Diagnosis not present

## 2016-11-02 LAB — HIV ANTIBODY (ROUTINE TESTING W REFLEX): HIV Screen 4th Generation wRfx: NONREACTIVE

## 2016-11-02 LAB — CBC
HEMATOCRIT: 37.2 % (ref 36.0–46.0)
HEMOGLOBIN: 12.3 g/dL (ref 12.0–15.0)
MCH: 30.8 pg (ref 26.0–34.0)
MCHC: 33.1 g/dL (ref 30.0–36.0)
MCV: 93.2 fL (ref 78.0–100.0)
Platelets: 333 10*3/uL (ref 150–400)
RBC: 3.99 MIL/uL (ref 3.87–5.11)
RDW: 13.2 % (ref 11.5–15.5)
WBC: 3.6 10*3/uL — ABNORMAL LOW (ref 4.0–10.5)

## 2016-11-02 LAB — BASIC METABOLIC PANEL
ANION GAP: 8 (ref 5–15)
BUN: 10 mg/dL (ref 6–20)
CALCIUM: 8.8 mg/dL — AB (ref 8.9–10.3)
CHLORIDE: 104 mmol/L (ref 101–111)
CO2: 24 mmol/L (ref 22–32)
Creatinine, Ser: 1 mg/dL (ref 0.44–1.00)
GFR calc non Af Amer: >60 mL/min (ref >60)
GLUCOSE: 95 mg/dL (ref 65–99)
Potassium: 3.8 mmol/L (ref 3.5–5.1)
Sodium: 136 mmol/L (ref 135–145)

## 2016-11-02 LAB — HEMOGLOBIN A1C
HEMOGLOBIN A1C: 5.2 % (ref 4.8–5.6)
Mean Plasma Glucose: 103 mg/dL

## 2016-11-02 LAB — T4, FREE: FREE T4: 0.83 ng/dL (ref 0.61–1.12)

## 2016-11-02 MED ORDER — ACETAMINOPHEN 325 MG PO TABS: 650.0000 mg | ORAL_TABLET | Freq: Four times a day (QID) | ORAL | 0 refills | Status: DC | PRN

## 2016-11-02 MED ORDER — BENZONATATE 200 MG PO CAPS: 200.0000 mg | ORAL_CAPSULE | Freq: Two times a day (BID) | ORAL | 0 refills | Status: DC

## 2016-11-02 MED ORDER — LEVOFLOXACIN 750 MG PO TABS: 750.0000 mg | ORAL_TABLET | Freq: Every day | ORAL | 0 refills | Status: AC

## 2016-11-02 MED ORDER — LEVOFLOXACIN 750 MG PO TABS
750.0000 mg | ORAL_TABLET | Freq: Every day | ORAL | Status: AC
  Administered 2016-11-02: 750 mg via ORAL
  Filled 2016-11-02: qty 1

## 2016-11-02 NOTE — Progress Notes (Addendum)
Pt has been educated on the shampoo ordered to treat the lice infection and the correct steps to apply. Pt's mother has asked to be the one to wash pt's hair. Will continue to monitor.  Viviano Simas, RN

## 2016-11-02 NOTE — Discharge Instructions (Signed)
Community acquired pneumonia: For your pneumonia, we started you on an antibiotic called levaquin. Please fill your prescription for your Levaquin and take it once a day for the next 3 days. Continue the tessalon medication for cough.  Head lice: You used the Permethrin shampoo in the hospital. However, we want to make sure that you are completely treated. Please fill your prescription for the Permethrin shampoo and use it again in 6 days. If you have roommates or have had close contacts recently please have them evaluated and treated if they have symptoms.     Reasons to come back to the hospital: if you have trouble breathing or get a fever after you complete your antibiotics.  We have an appointment scheduled for you for 11/07/16 at 3:00 PM, the information is listed above.  Please go to the appointments we have scheduled for you. Take Care!    Community-Acquired Pneumonia, Adult Pneumonia is an infection of the lungs. One type of pneumonia can happen while a person is in a hospital. A different type can happen when a person is not in a hospital (community-acquired pneumonia). It is easy for this kind to spread from person to person. It can spread to you if you breathe near an infected person who coughs or sneezes. Some symptoms include:  A dry cough.  A wet (productive) cough.  Fever.  Sweating.  Chest pain. Follow these instructions at home:  Take over-the-counter and prescription medicines only as told by your doctor.  Only take cough medicine if you are losing sleep.  If you were prescribed an antibiotic medicine, take it as told by your doctor. Do not stop taking the antibiotic even if you start to feel better.  Sleep with your head and neck raised (elevated). You can do this by putting a few pillows under your head, or you can sleep in a recliner.  Do not use tobacco products. These include cigarettes, chewing tobacco, and e-cigarettes. If you need help quitting, ask your  doctor.  Drink enough water to keep your pee (urine) clear or pale yellow. A shot (vaccine) can help prevent pneumonia. Shots are often suggested for:  People older than 32 years of age.  People older than 32 years of age:  Who are having cancer treatment.  Who have long-term (chronic) lung disease.  Who have problems with their body's defense system (immune system). You may also prevent pneumonia if you take these actions:  Get the flu (influenza) shot every year.  Go to the dentist as often as told.  Wash your hands often. If soap and water are not available, use hand sanitizer. Contact a doctor if:  You have a fever.  You lose sleep because your cough medicine does not help. Get help right away if:  You are short of breath and it gets worse.  You have more chest pain.  Your sickness gets worse. This is very serious if:  You are an older adult.  Your body's defense system is weak.  You cough up blood. This information is not intended to replace advice given to you by your health care provider. Make sure you discuss any questions you have with your health care provider. Document Released: 12/27/2007 Document Revised: 12/16/2015 Document Reviewed: 11/04/2014 Elsevier Interactive Patient Education  2017 [employer], Adult Lice are tiny insects with claws on the ends of their legs. They are small parasites that live on the human body. A parasite is an insect that lives off  another animal and cannot survive without it. Lice often make their home in a person's hair, such as hair on the head or in the pubic area. Pubic lice are sometimes referred to as crabs. Lice hatch from little round eggs, which are attached to the base of hairs. Lice eggs are also called nits. Lice can spread from one person to another. Lice crawl. They do not fly or jump. Lice cause skin irritation and itching in the area of the infested hair. Although having lice can be annoying, it is not  dangerous. Lice do not spread diseases. Treatment will usually clear up the symptoms within a few days. What are the causes? This condition may be caused by:  Having very close contact with an infested person.  Sharing infested items that touch your skin and hair. These include personal items, such as hats, combs, brushes, towels, clothing, pillowcases, or sheets. Pubic lice are spread through sexual contact. What increases the risk? Although having lice is more common among young children, anyone can get lice. Lice tend to thrive in warm weather, so that type of weather increases the risk. What are the signs or symptoms? Symptoms of this condition include:  Itchiness in the affected area.  Skin irritation.  Feeling of something moving in the hair.  Rash or sores on the skin.  Tiny flakes or sacs near the scalp. These may be white, yellow, or tan.  Tiny bugs crawling on the hair or scalp. How is this diagnosed? This condition is diagnosed based on:  Your symptoms.  A physical exam.  Your health care provider will examine the affected area closely for live lice, tiny eggs (nits), and empty egg cases.  Eggs are typically yellow or tan in color. Empty egg cases are whitish. Lice are gray or brown. How is this treated? Treatment for this condition includes:  Using a hair rinse that contains a mild insecticide to kill lice. Your health care provider will recommend a prescription or over-the-counter rinse.  Removing lice, eggs, and egg cases by using a comb or tweezers.  Washing and bagging your clothing and bedding. Pregnant women should not use medicated shampoo or cream without first talking to their health care provider. Follow these instructions at home: Using medicated rinse  Apply medicated rinse as told by your health care provider. Follow the label instructions carefully. General instructions for applying rinses may include these steps: 1. Put on an old shirt or  underwear, or use an old towel in case of staining from the rinse. 2. Wash and towel-dry your head or pubic area before applying the rinse if directed to do so. 3. When your hair is dry, apply the rinse. Leave the rinse in your hair for the amount of time specified in the instructions. 4. Rinse the area with water. 5. Comb your wet hair with a fine-tooth comb. Comb it close to the skin and down to the ends, removing any lice, eggs, or egg cases. A lice comb may be included with the medicated rinse. 6. Do not wash the infested hair for 2 days while the medicine kills the lice. 7. After the treatment, repeat combing out your hair and removing lice, eggs, or egg cases from the hair every 2-3 days. Do this for about 2-3 weeks. After treatment, the remaining lice should be moving more slowly. 8. Repeat the treatment if necessary in 7-10 days. General instructions   Remove any remaining lice, eggs, or egg cases using a fine-tooth comb.  Use  hot water to wash all towels, hats, scarves, jackets, bedding, and clothing that you have recently used.  Put any non-washable items that may have been exposed into plastic bags. Keep the bags closed for 2 weeks.  Soak all combs and brushes in hot water for 10 minutes.  Vacuum furniture to remove any loose hair. There is no need to use chemicals, which can be poisonous (toxic). Lice survive for only 1-2 days away from human skin. Eggs may survive for only 1 week.  For pubic lice, tell any sexual partners to seek treatment.  For head lice, ask your health care provider if other family members or close contacts should be examined or treated as well.  Keep all follow-up visits as told by your health care provider. This is important. Contact a health care provider if:  You develop sores that look infected.  Your rash or sores do not go away in 1 week.  The lice or eggs return or do not go away in spite of treatment. Summary  Lice are tiny parasitic insects  that live on the human body. A parasite is an insect that lives off another animal and cannot survive without it.  Lice can spread from one person to another through close contact with an infested person or by sharing personal items, such as combs, brushes, or hats.  Lice can be treated with a medicated rinse. Follow your health care provider's instructions, or instructions on the label, if you are being treated with this medicine.  Ask your health care provider if your family members or close contacts should be treated for lice. This information is not intended to replace advice given to you by your health care provider. Make sure you discuss any questions you have with your health care provider. Document Released: 07/10/2005 Document Revised: 07/19/2016 Document Reviewed: 07/19/2016 Elsevier Interactive Patient Education  2017 [employer]  Permethrin lotion What is this medicine? PERMETHRIN (per METH rin) is used to treat head lice infestations. It acts by destroying both the lice and their eggs. This medicine may be used for other purposes; ask your health care provider or pharmacist if you have questions. COMMON BRAND NAME(S): Nix Complete Lice Elimination Kit, Nix Lice Killing Creme Rinse What should I tell my health care provider before I take this medicine? They need to know if you have any of these conditions: -asthma -an unusual or allergic reaction to permethrin, veterinary or household insecticides, other medicines, chrysanthemums, foods, dyes, or preservatives -pregnant or trying to get pregnant -breast-feeding How should I use this medicine? This medicine is for external use only. Do not take by mouth. Follow the directions on the product label. Do not wash hair with conditioner or a combination shampoo-conditioner immediately before applying this medicine. Follow product-specific instructions for length of application and product removal. All products should be rinsed from the  hair over a sink or tub to limit skin exposure; do not use hot water. In general, do not re-wash the hair for 1 to 2 days after use. Talk to your pediatrician regarding the use of this medicine in children. While this drug may be prescribed for children as young as 31 months old for selected conditions, precautions do apply. Overdosage: If you think you have taken too much of this medicine contact a poison control center or emergency room at once. NOTE: This medicine is only for you. Do not share this medicine with others. What if I miss a dose? This does not apply. What may  interact with this medicine? Interactions are not expected. Do not use any other skin products on the affected area without telling your doctor or health care professional. This list may not describe all possible interactions. Give your health care provider a list of all the medicines, herbs, non-prescription drugs, or dietary supplements you use. Also tell them if you smoke, drink alcohol, or use illegal drugs. Some items may interact with your medicine. What should I watch for while using this medicine? This medicine is used as a single application treatment. If live lice are observed 7 or more days after initial application, a second treatment may be needed. Head lice can be spread from one person to another by direct contact with clothing, hats, scarves, bedding, towels, washcloths, hairbrushes, and combs. All members of your household should be examined for head lice and should receive treatment if they are found to be infected. If you have any questions about this, check with your doctor or health care professional. To prevent reinfection or spreading of the infection, the following steps should be taken: Machine wash all clothing, bedding, towels, and washcloths in very hot water and dry them using the hot cycle of a dryer for at least 20 minutes. Clothing or bedding that cannot be washed should be dry cleaned or sealed in an  airtight plastic bag for 2 weeks. Shampoo any wigs or hairpieces. You should also wash all hairbrushes and combs in very hot soapy water (above 130 degrees F) for 5 to 10 minutes. Do not share your hairbrushes or combs with other people. Wash all toys in very hot water (above 130 degrees F) for 5 to 10 minutes or seal in an airtight plastic bag for 2 weeks. Also, clean the house or room by vacuuming furniture, rugs, and floors. What side effects may I notice from receiving this medicine? Side effects that usually do not require medical attention (report to your doctor or health care professional if they continue or are bothersome): -itching -redness or mild swelling of the scalp -stinging or burning -tingling sensation This list may not describe all possible side effects. Call your doctor for medical advice about side effects. You may report side effects to FDA at 1-800-FDA-1088. Where should I keep my medicine? Keep out of the reach of children. Store at room temperature away from heat and direct light. Do not refrigerate or freeze. After treatment, throw away any unused medicine. NOTE: This sheet is a summary. It may not cover all possible information. If you have questions about this medicine, talk to your doctor, pharmacist, or health care provider.  2018 Elsevier/[employer] (2016-02-04 11:51:24)

## 2016-11-02 NOTE — Progress Notes (Signed)
Pt has been discharge from facility. She left floor with mother and volunteer via wheelchair with no signs of distress. Pt and mother verbalized discharge instructions.

## 2016-11-02 NOTE — Discharge Summary (Signed)
[facility] Discharge Summary  Patient name: Angela Odom Medical record number: [id number] Date of birth: September 08, 1985 Age: 31 y.o. Gender: female Date of Admission: 11/01/2016  Date of Discharge: 11/02/16 Admitting Physician: Lupita Dawn, MD  Primary Care Provider: Olam Idler, MD Consultants: CT Surgery   Indication for Hospitalization: Left-sided CP and PNA  Discharge Diagnoses/Problem List:  CAP Downs syndrome Elevated TSH w/normal T4 Left-sided chest pain  Seasonal allergies Head lice   Disposition: Home  Discharge Condition: Stable  Discharge Exam:  General: Well appearing, NAD Cardiovascular: RRR, No MRG Respiratory: CTAB, no increased work of breathing  MSK: TTP along left rib, underneath left breast. No rashes or bruising   Brief Hospital Course:   Community Acquired Pneumonia:  Angela Odom is a 31 y.o. female with a h/o of Downs syndrome and PE who presented to the ED on 4/11 with cough, congestion, hoarseness and left-sided chest pain reproducible to palpation. On admission, she was slightly hypotensive but her vitals were otherwise stable. No O2 requirement. CTA of chest was obtained which noted an infiltrate on medial segmental right middle lobe and posterior segment of the right lower lobe consistent with pneumonia. Initially, the CTA was read as having a small amount of air noted in the middle mediastinum which appears to separate the trachea and esophagus. CT surgery was consulted and recommended an esophagram. However, after re-read radiology reported that the CT was misread and that upon review, there is no evidence of pneumomediastinum and the esophagram was cancelled. She was placed on IV Levoquin on day 0 of her hospital stay and was transitioned to PO Levoquin on day 1. She was also provided duonebs PRN and tessalon perles BID throughout her stay. Upon discharge, she had a normal respiratory exam.  Left-sided chest pain:  No  history of cardiac issues, asthma or other lung diseases. On admission, exam showed left-sided chest pain with palpation along left lower rib. EKG showed sinus rhythm, rate of 64 bpm. Troponins were trended and ACS was ruled out. Tylenol which improved her pain.   Head lice:  Found to have nits present in her hair. She was treated with Permethrin 1%.   Issues for Follow Up:  1. CAP: She was prescribed Levoquin 750 mg x 3 days for CAP to complete a total 5 day course. Ensure she completed her antibiotic course. 2. Elevated TSH w/normal T4: During her admission her TSH was 5.8 and her T4 was 0.83. Please recheck in 6-8 weeks.  3. Head lice: Treated with Permethrin 1% while in hospital, will need repeat treatment with Permethrin on 11/08/16. Please follow up to make sure she filled her prescription and remind her to use it on 4/18. 4. Left-sided chest pain: Thought to be musculoskeletal in etiology, resolved with Tylenol. Please check to see if pain has improved or resolved.  5. Needs PCP. Patient does not have a PCP. She is going to have her hospital follow up at the family medicine center. She will need a separate new patient visit scheduled.   Significant Procedures: None  Significant Labs and Imaging:   Results for orders placed or performed during the hospital encounter of 11/01/16 (from the past 48 hour(s))  Urinalysis, Routine w reflex microscopic- may I&O cath if menses     Status: Abnormal   Collection Time: 11/01/16  1:02 AM  Result Value Ref Range   Color, Urine STRAW (A) YELLOW   APPearance CLEAR CLEAR   Specific Gravity, Urine 1.006 1.005 -  1.030   pH 5.0 5.0 - 8.0   Glucose, UA NEGATIVE NEGATIVE mg/dL   Hgb urine dipstick NEGATIVE NEGATIVE   Bilirubin Urine NEGATIVE NEGATIVE   Ketones, ur NEGATIVE NEGATIVE mg/dL   Protein, ur NEGATIVE NEGATIVE mg/dL   Nitrite NEGATIVE NEGATIVE   Leukocytes, UA NEGATIVE NEGATIVE  Pregnancy, urine     Status: None   Collection Time: 11/01/16   1:02 AM  Result Value Ref Range   Preg Test, Ur NEGATIVE NEGATIVE    Comment:        THE SENSITIVITY OF THIS METHODOLOGY IS >20 mIU/mL.   CBC with Differential     Status: None   Collection Time: 11/01/16  6:21 AM  Result Value Ref Range   WBC 4.3 4.0 - 10.5 K/uL   RBC 4.06 3.87 - 5.11 MIL/uL   Hemoglobin 12.7 12.0 - 15.0 g/dL   HCT 37.2 36.0 - 46.0 %   MCV 91.6 78.0 - 100.0 fL   MCH 31.3 26.0 - 34.0 pg   MCHC 34.1 30.0 - 36.0 g/dL   RDW 13.3 11.5 - 15.5 %   Platelets 338 150 - 400 K/uL   Neutrophils Relative % 58 %   Neutro Abs 2.5 1.7 - 7.7 K/uL   Lymphocytes Relative 27 %   Lymphs Abs 1.1 0.7 - 4.0 K/uL   Monocytes Relative 8 %   Monocytes Absolute 0.3 0.1 - 1.0 K/uL   Eosinophils Relative 6 %   Eosinophils Absolute 0.3 0.0 - 0.7 K/uL   Basophils Relative 1 %   Basophils Absolute 0.0 0.0 - 0.1 K/uL  Comprehensive metabolic panel     Status: Abnormal   Collection Time: 11/01/16  6:21 AM  Result Value Ref Range   Sodium 137 135 - 145 mmol/L   Potassium 3.6 3.5 - 5.1 mmol/L   Chloride 102 101 - 111 mmol/L   CO2 22 22 - 32 mmol/L   Glucose, Bld 95 65 - 99 mg/dL   BUN 14 6 - 20 mg/dL   Creatinine, Ser 0.93 0.44 - 1.00 mg/dL   Calcium 9.0 8.9 - 10.3 mg/dL   Total Protein 6.7 6.5 - 8.1 g/dL   Albumin 3.4 (L) 3.5 - 5.0 g/dL   AST 26 15 - 41 U/L   ALT 17 14 - 54 U/L   Alkaline Phosphatase 58 38 - 126 U/L   Total Bilirubin 0.5 0.3 - 1.2 mg/dL   GFR calc non Af Amer >60 >60 mL/min   GFR calc Af Amer >60 >60 mL/min    Comment: (NOTE) The eGFR has been calculated using the CKD EPI equation. This calculation has not been validated in all clinical situations. eGFR's persistently <60 mL/min signify possible Chronic Kidney Disease.    Anion gap 13 5 - 15  HIV antibody (Routine Testing)     Status: None   Collection Time: 11/01/16  1:00 PM  Result Value Ref Range   HIV Screen 4th Generation wRfx Non Reactive Non Reactive    Comment: (NOTE) Performed At: [facility] [address] [place], [place] [id number] Lindon Romp MD [id number]   TSH     Status: Abnormal   Collection Time: 11/01/16  1:00 PM  Result Value Ref Range   TSH 5.823 (H) 0.350 - 4.500 uIU/mL    Comment: Performed by a 3rd Generation assay with a functional sensitivity of <=0.01 uIU/mL.  Hemoglobin A1c     Status: None   Collection Time: 11/01/16  1:00 PM  Result Value Ref Range   Hgb A1c MFr Bld 5.2 4.8 - 5.6 %    Comment: (NOTE)         Pre-diabetes: 5.7 - 6.4         Diabetes: >6.4         Glycemic control for adults with diabetes: <7.0    Mean Plasma Glucose 103 mg/dL    Comment: (NOTE) Performed At: [facility] [place], [place] [id number] Lindon Romp MD [id number]   Troponin I     Status: None   Collection Time: 11/01/16  3:53 PM  Result Value Ref Range   Troponin I <0.03 <0.03 ng/mL  Basic metabolic panel     Status: Abnormal   Collection Time: 11/02/16  5:04 AM  Result Value Ref Range   Sodium 136 135 - 145 mmol/L   Potassium 3.8 3.5 - 5.1 mmol/L   Chloride 104 101 - 111 mmol/L   CO2 24 22 - 32 mmol/L   Glucose, Bld 95 65 - 99 mg/dL   BUN 10 6 - 20 mg/dL   Creatinine, Ser 1.00 0.44 - 1.00 mg/dL   Calcium 8.8 (L) 8.9 - 10.3 mg/dL   GFR calc non Af Amer >60 >60 mL/min   GFR calc Af Amer >60 >60 mL/min    Comment: (NOTE) The eGFR has been calculated using the CKD EPI equation. This calculation has not been validated in all clinical situations. eGFR's persistently <60 mL/min signify possible Chronic Kidney Disease.    Anion gap 8 5 - 15  CBC     Status: Abnormal   Collection Time: 11/02/16  5:04 AM  Result Value Ref Range   WBC 3.6 (L) 4.0 - 10.5 K/uL   RBC 3.99 3.87 - 5.11 MIL/uL   Hemoglobin 12.3 12.0 - 15.0 g/dL   HCT 37.2 36.0 - 46.0 %   MCV 93.2 78.0 - 100.0 fL   MCH 30.8 26.0 - 34.0 pg   MCHC 33.1 30.0 - 36.0 g/dL   RDW 13.2 11.5 - 15.5 %   Platelets 333 150 - 400 K/uL  T4, free     Status: None    Collection Time: 11/02/16  5:04 AM  Result Value Ref Range   Free T4 0.83 0.61 - 1.12 ng/dL    Comment: (NOTE) Biotin ingestion may interfere with free T4 tests. If the results are inconsistent with the TSH level, previous test results, or the clinical presentation, then consider biotin interference. If needed, order repeat testing after stopping biotin.     Ct Angio Chest Pe W/cm &/or Wo Cm  Addendum Date: 11/01/2016   ADDENDUM REPORT: 11/01/2016 17:31 ADDENDUM: This patient was admitted to the family medicine service and the cardiothoracic surgery service was consulted due to a question of pneumomediastinum on the original interpretation provided for this chest CT angiogram study. The cardiothoracic surgery service requested an esophagram for further evaluation. As the fluoroscopy radiologist on service today, I have reviewed this chest CT angiogram prior to performing the study. Per my review, there is no evidence of pneumomediastinum on this chest CT. The foci of gas questioned in the middle mediastinum represent normal intraluminal gas within the thoracic esophagus, which appears normal with no evidence of esophageal wall thickening. There is no fat stranding or fluid collection within the mediastinum. No pleural effusions. My recommendation is that the esophagram be canceled unless there are other clinical indications for the study. Per my telephone discussion with the family medicine physician  Dr. Dennie Fetters on 11/01/2016 at 5:08 pm, there was no clinical suspicion for esophageal perforation. My recommendations were called by telephone on 11/01/2016 at 5:18 pm to the on-call cardiothoracic surgeon Dr. Servando Snare as relayed to Dr. Servando Snare in the operating room by Horton Chin, who verbally acknowledged these results. Electronically Signed   By: Ilona Sorrel M.D.   On: 11/01/2016 17:31   Result Date: 11/01/2016 CLINICAL DATA:  Chest pain EXAM: CT ANGIOGRAPHY CHEST WITH CONTRAST  TECHNIQUE: Multidetector CT imaging of the chest was performed using the standard protocol during bolus administration of intravenous contrast. Multiplanar CT image reconstructions and MIPs were obtained to evaluate the vascular anatomy. CONTRAST:  60 mL Isovue 370 nonionic COMPARISON:  None. FINDINGS: Cardiovascular: There is no demonstrable pulmonary embolus. There is no thoracic aortic aneurysm or dissection. Visualized great vessels appear normal. There is no appreciable aortic calcification. No pericardial thickening. Mediastinum/Nodes: Thyroid appears unremarkable. There is no appreciable thoracic adenopathy. There are scattered foci of air in the middle mediastinum which appears separate from the esophagus. There is no associated inflammatory change in these areas. No other mediastinal air is seen. Lungs/Pleura: There are areas of patchy airspace consolidation in the medial segment of the right middle lobe and in the posterior segment of the right lower lobe. Lungs elsewhere are clear. No pleural effusion or pleural thickening. No pneumothorax. Upper Abdomen: On the lower most images, there is a questionable small amount of air in the porta hepatis region. Visualized upper abdominal structures otherwise appear unremarkable. Musculoskeletal: No bony lesions are evident. Review of the MIP images confirms the above findings. IMPRESSION: No demonstrable pulmonary embolus. Patchy infiltrate in the medial segment right middle lobe and posterior segment right lower lobe, likely early pneumonia in these areas. Lungs elsewhere clear. There is a small amount of air in the middle mediastinum which appears separate from the trachea and esophagus. Question small amount of pneumomediastinum of uncertain etiology. If there is any clinical suspicion for esophageal injury, contrast swallow may be indicated to further evaluate. On the inferior-most images, there appears to be a small amount of air in the porta hepatis region.  This finding may warrant dedicated CT abdomen study to further assess. No adenopathy.  No aortic dissection. Electronically Signed: By: Lowella Grip III M.D. On: 11/01/2016 09:10   Dg Abd 2 Views  Result Date: 11/01/2016 CLINICAL DATA:  Abnormal chest CT EXAM: ABDOMEN - 2 VIEW COMPARISON:  11/01/2016 FINDINGS: Contrast in the renal collecting system from earlier chest CT. Urinary bladder normal. No hydronephrosis Normal bowel gas pattern. No free air identified on the upright view. No abnormal calcifications. No bony abnormality. IMPRESSION: Negative. No evidence of free air. Low-density in the porta hepatis on earlier chest CT appears to represent fat rather than gas. Electronically Signed   By: Franchot Gallo M.D.   On: 11/01/2016 11:42     Results/Tests Pending at Time of Discharge: None  Discharge Medications:  Allergies as of 11/02/2016      Reactions   Ceclor [cefaclor] Hives   Sulfa Antibiotics Hives      Medication List    TAKE these medications   acetaminophen 325 MG tablet Commonly known as:  TYLENOL Take 2 tablets (650 mg total) by mouth every 6 (six) hours as needed for mild pain (or Fever >/= 101).   benzonatate 200 MG capsule Commonly known as:  TESSALON Take 1 capsule (200 mg total) by mouth 2 (two) times daily.   fluticasone 50 MCG/ACT nasal  spray Commonly known as:  FLONASE Place 1 spray into both nostrils daily.   levofloxacin 750 MG tablet Commonly known as:  LEVAQUIN Take 1 tablet (750 mg total) by mouth daily. Start taking on:  11/03/2016      Discharge Instructions: Please refer to Patient Instructions section of EMR for full details.  Patient was counseled important signs and symptoms that should prompt return to medical care, changes in medications, dietary instructions, activity restrictions, and follow up appointments.   Follow-Up Appointments: Follow-up [place], DO. Go on 11/07/2016.   Specialty:  Family  Medicine Why:  hospital follow up at 3:00PM Contact information: [address] [place] [address] [phone]           Smitty Cords, [place], PGY-2

## 2016-11-02 NOTE — Care Management Obs Status (Signed)
MEDICARE OBSERVATION STATUS NOTIFICATION   Patient Details  Name: Angela Odom MRN: [id number] Date of Birth: June 07, 1986   Medicare Observation Status Notification Given:  Yes    Gala Lewandowsky, RN 11/02/2016, 12:24 PM

## 2016-11-07 ENCOUNTER — Encounter: Payer: Self-pay | Admitting: Family Medicine

## 2016-11-07 ENCOUNTER — Ambulatory Visit (INDEPENDENT_AMBULATORY_CARE_PROVIDER_SITE_OTHER): Payer: Medicare Other | Admitting: Family Medicine

## 2016-11-07 ENCOUNTER — Inpatient Hospital Stay: Payer: Medicare Other | Admitting: Internal Medicine

## 2016-11-07 VITALS — BP 116/72 | HR 50 | Temp 98.0°F | Ht 59.0 in | Wt 228.0 lb

## 2016-11-07 DIAGNOSIS — I749 Embolism and thrombosis of unspecified artery: Secondary | ICD-10-CM

## 2016-11-07 DIAGNOSIS — Z09 Encounter for follow-up examination after completed treatment for conditions other than malignant neoplasm: Secondary | ICD-10-CM

## 2016-11-07 NOTE — Patient Instructions (Signed)
Angela Odom, you were seen today in clinic for a hospital follow up. You finished your course of antibiotics and you will complete your course of permetherin for lice tomorrow (4/18).  For your left sided discomfort I would recommend tylenol and a heating pad.   Please follow up in our clinic in 6 weeks to have your thyroid levels checked.   Very nice seeing you, Daniel L. Myrtie Soman, MD [facility] Family Medicine Resident PGY-1 11/07/2016 4:47 PM

## 2016-11-08 NOTE — Progress Notes (Signed)
    Subjective:  Angela Odom is a 31 y.o. female who presents to the [facility] today for hospital follow-up  HPI:  Community acquired pneumonia: Patient completed 3 days of antibiotics in the hospital and was discharged with instructions to continue 2 more days for a total of 5 days of Levaquin.  She denies any pleuritic chest pain, fevers, productive cough or chills.  Left-sided chest pain: Negative ACS workup in the hospital and states that sneezing coughing and deep inspiration illicit pain.  Denies any pain with ambulation and pain is non-positional.  Denies any increase in lower extremity swelling or shortness of breath. No trauma or increase in exercise frequency recently.   Lice: Noted to have lice in her hair during admission and was treated with permethrin during admission.  Discharged with instructions to continue shampoo on 11/08/2016 and patient did fill his prescription was planning on using it. Denies any itching symptoms.  Subclinical hypothyroidism: Noted to have elevated TSH with normal T4 during recent admission. Basically subclinical hyperthyroidism and recommended to recheck hyperthyroidism six-day weeks after discharge. Patient denies any symptoms associated with hypothyroidism.   PMH: Downs syndrome Tobacco use: None Medication: reviewed and updated ROS: see HPI   Objective:  Physical Exam: BP 116/72   Pulse (!) 50   Temp 98 F (36.7 C) (Oral)   Ht  (1.499 m)   Wt 103.4 kg (228 lb)   LMP 11/03/2016 (Exact Date) Comment: NEGATIVE PREGNANCY TEST  SpO2 99%   BMI 46.05 kg/m   Gen: 31 year old female in NAD, resting comfortably CV: RRR with no murmurs appreciated Pulm: NWOB, CTAB with no crackles, wheezes, or rhonchi GI: Normal bowel sounds present. Soft, Nontender, Nondistended. MSK: no edema, cyanosis, or clubbing noted. Left chest tender to palpation under her breast. Full range of motion.  Skin: warm, dry Neuro: grossly normal, moves all  extremities Psych: Normal affect and thought content  No results found for this or any previous visit (from the past 72 hour(s)).   Assessment/Plan:  Subclinical hypothyroidism: Asymptomatic and noted to have elevated TSH with normal T4 during recent admission. - Recheck TSH and free T4 six-day weeks from discharge (discharge date 11/01/2017)  Left-sided chest pain: ACS workup negative in the hospital and has no current signs or symptoms consistent with pneumonia and recently completed course of Levaquin. Vital signs stable with no concerns for PE and lower extremities show no signs of DVT. Pain is reproducible on exam. - Recommend Tylenol and heating pad - Return precautions discussed with patient  Lice: Patient has picked up the prescription and plans to treat tomorrow - Permethrin treatment 14 2018

## 2018-06-15 IMAGING — CT CT ANGIO CHEST
3 of 7 series · 17 of 36 positions shown · IV contrast (Omni 300)
Comparison: None.

ADDENDUM:
This patient was admitted to the [REDACTED] service and the
cardiothoracic surgery service was consulted due to a question of
pneumomediastinum on the original interpretation provided for this
chest CT angiogram study. The cardiothoracic surgery service
requested an esophagram for further evaluation. As the fluoroscopy
radiologist on service today, I have reviewed this chest CT
angiogram prior to performing the study. Per my review, there is no
evidence of pneumomediastinum on this chest CT. The foci of gas
questioned in the middle mediastinum represent normal intraluminal
gas within the thoracic esophagus, which appears normal with no
evidence of esophageal wall thickening. There is no fat stranding or
fluid collection within the mediastinum. No pleural effusions. My
recommendation is that the esophagram be canceled unless there are
other clinical indications for the study.

Per my telephone discussion with the [REDACTED] physician Dr.
Dezmond Welter on 11/01/2016 at [DATE], there was no clinical
suspicion for esophageal perforation. My recommendations were called
by telephone on 11/01/2016 at [DATE] to the on-call cardiothoracic
surgeon Dr. CLEVELAND as relayed to Dr. CLEVELAND in the operating
room by Dleke Tiger, who verbally acknowledged these results.
CLINICAL DATA: Chest pain
EXAM:
CT ANGIOGRAPHY CHEST WITH CONTRAST
TECHNIQUE: Multidetector CT imaging of the chest was performed using the
standard protocol during bolus administration of intravenous
contrast. Multiplanar CT image reconstructions and MIPs were
obtained to evaluate the vascular anatomy.
CONTRAST:  60 mL Isovue 370 nonionic

[Series 7: pe lung · axial · 0.76mm/px · z∈[-260,-124]mm · 4 of 114 slices shown]
[im 23/114  mediastinal]
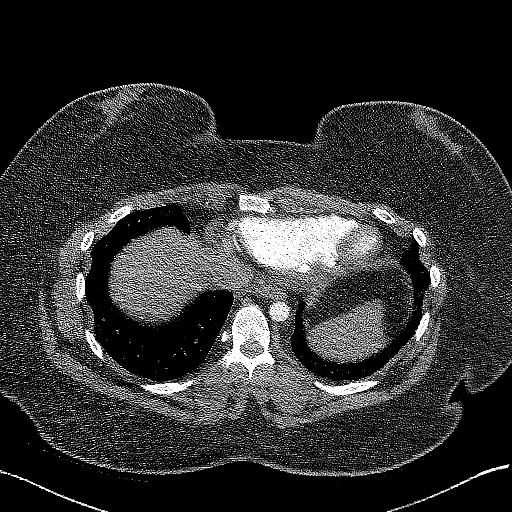
[im 46/114  mediastinal]
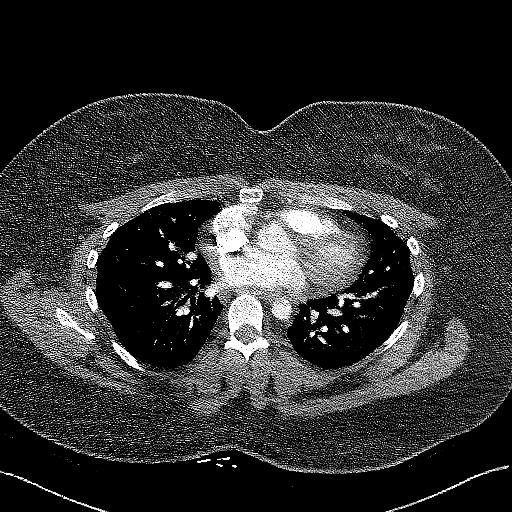
[im 68/114  mediastinal]
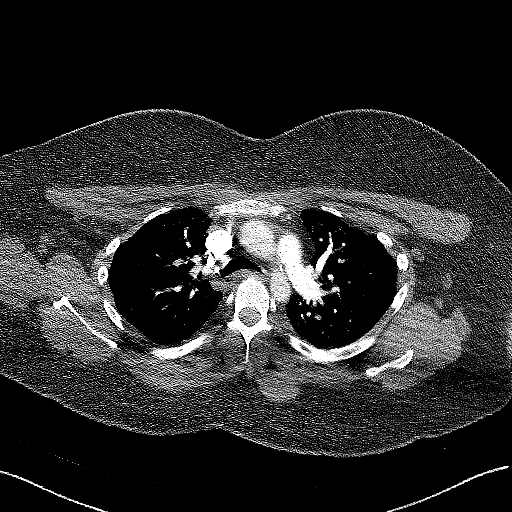
[im 91/114  mediastinal]
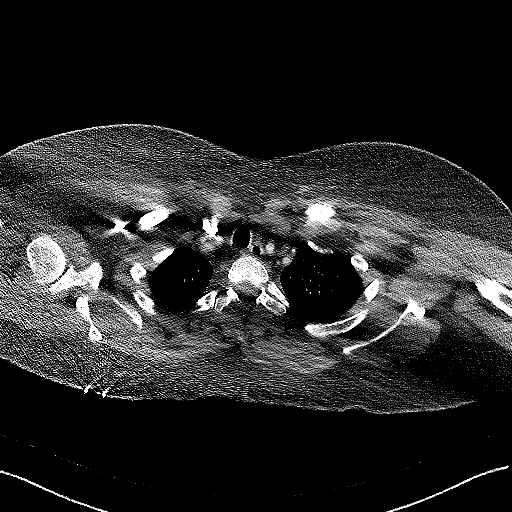

[Series 8: pe thins · axial · 0.76mm/px · z∈[-318,-98]mm · 12 of 259 slices shown]
[im 20/259  lung]
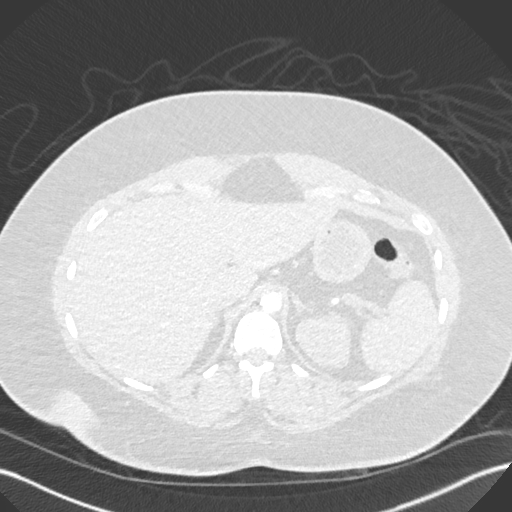
[im 40/259  mediastinal]
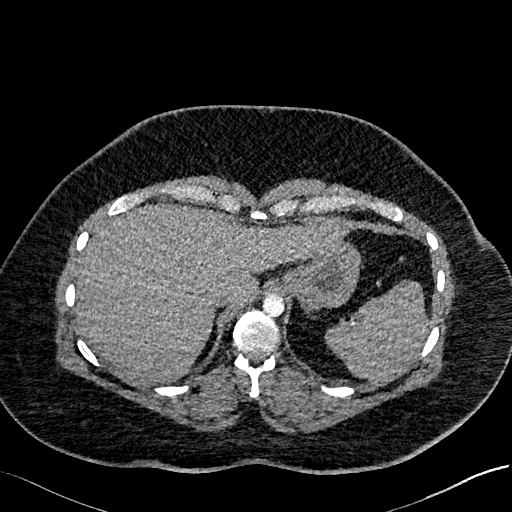
[im 60/259  lung]
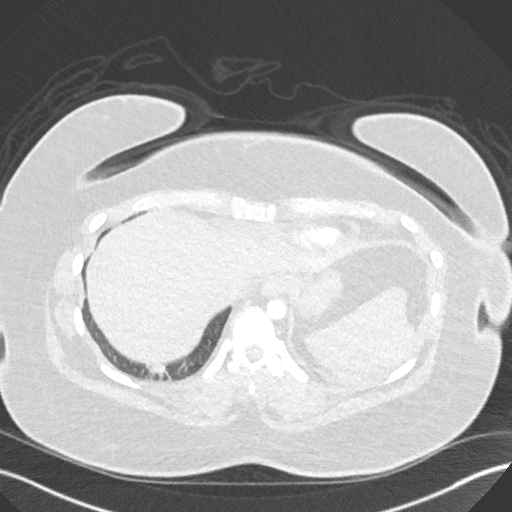
[im 80/259  mediastinal]
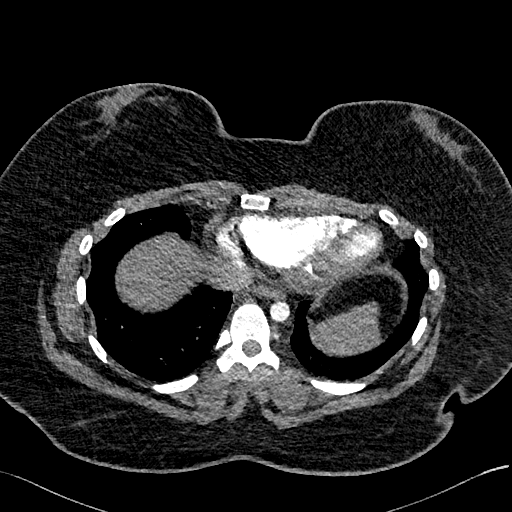
[im 100/259  lung]
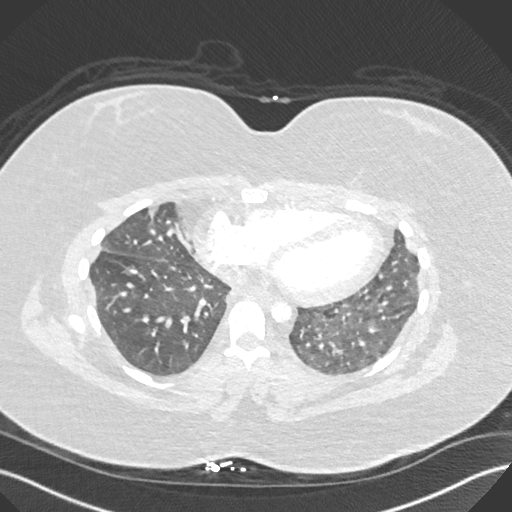
[im 120/259  mediastinal]
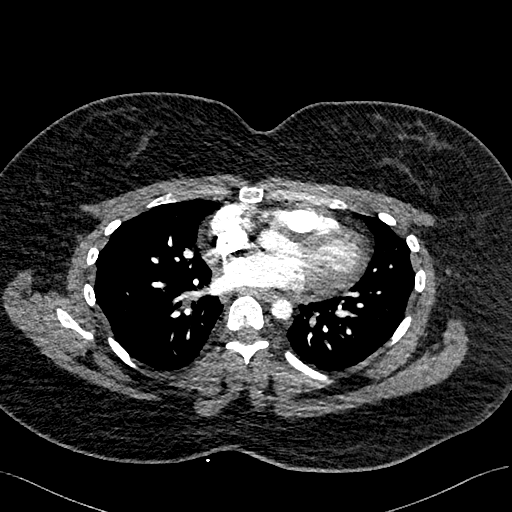
[im 139/259  lung]
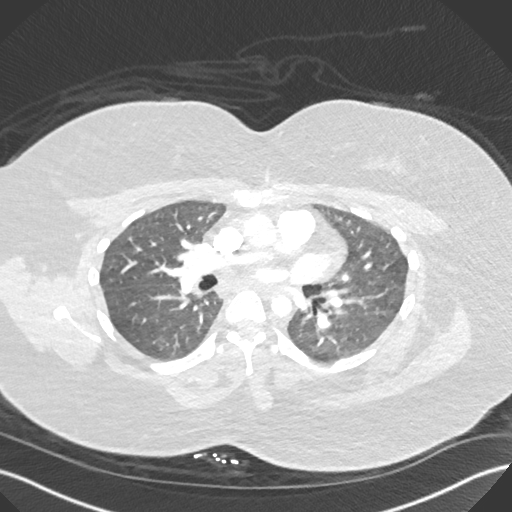
[im 159/259  mediastinal]
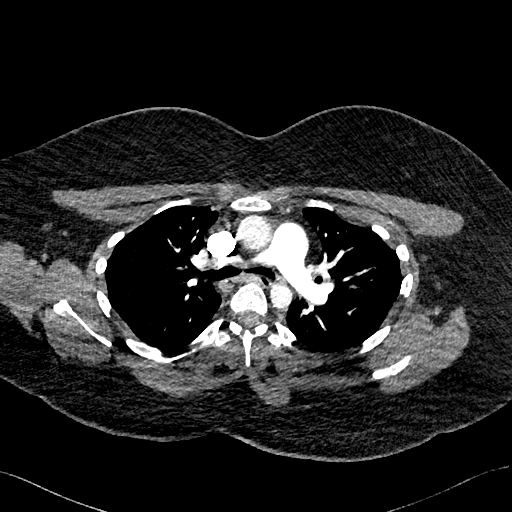
[im 179/259  lung]
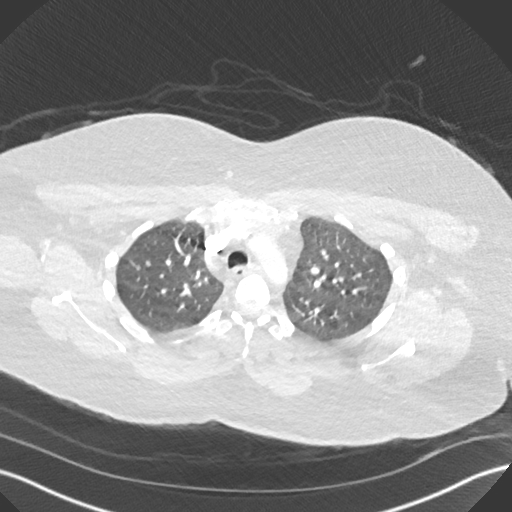
[im 199/259  mediastinal]
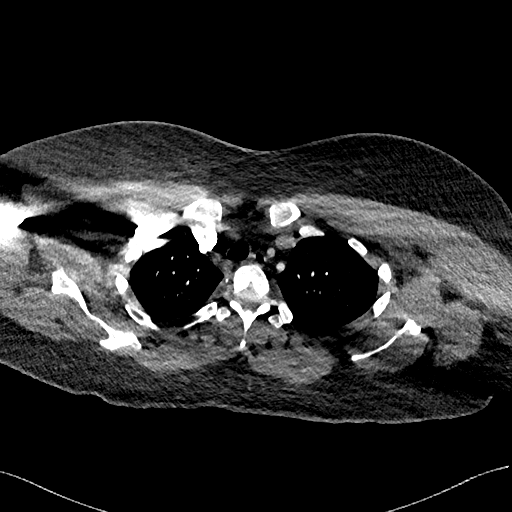
[im 219/259  lung]
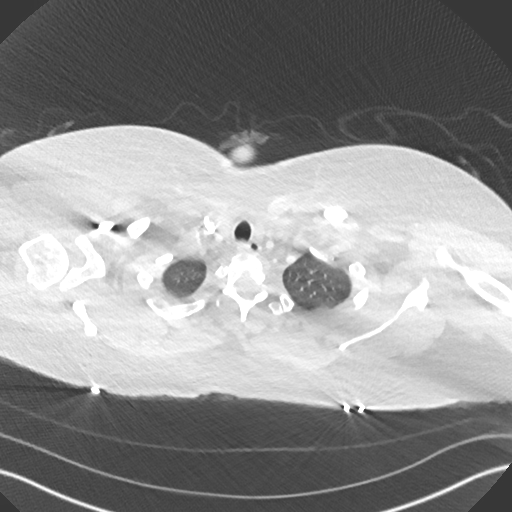
[im 239/259  mediastinal]
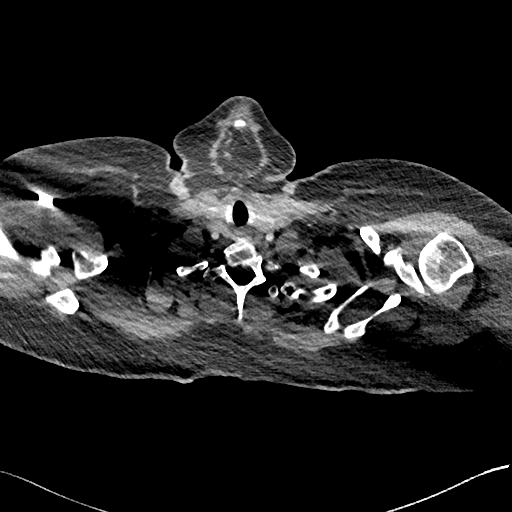

[Series 9: pe 2mm cor · coronal · 0.52mm/px · 1 of 145 slices shown]
[im 73/145  mediastinal]
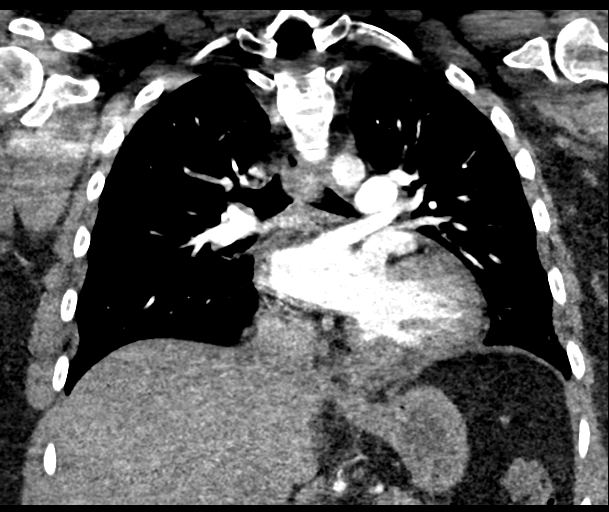

[17 of 36 positions shown; findings below may reference images not displayed]

FINDINGS: Cardiovascular: There is no demonstrable pulmonary embolus. There is
no thoracic aortic aneurysm or dissection. Visualized great vessels
appear normal. There is no appreciable aortic calcification. No
pericardial thickening.

Mediastinum/Nodes: Thyroid appears unremarkable. There is no
appreciable thoracic adenopathy.

There are scattered foci of air in the middle mediastinum which
appears separate from the esophagus. There is no associated
inflammatory change in these areas. No other mediastinal air is
seen.

Lungs/Pleura: There are areas of patchy airspace consolidation in
the medial segment of the right middle lobe and in the posterior
segment of the right lower lobe. Lungs elsewhere are clear. No
pleural effusion or pleural thickening. No pneumothorax.

Upper Abdomen: On the lower most images, there is a questionable
small amount of air in the porta hepatis region. Visualized upper
abdominal structures otherwise appear unremarkable.

Musculoskeletal: No bony lesions are evident.

Review of the MIP images confirms the above findings.
IMPRESSION: No demonstrable pulmonary embolus.

Patchy infiltrate in the medial segment right middle lobe and
posterior segment right lower lobe, likely early pneumonia in these
areas. Lungs elsewhere clear.

There is a small amount of air in the middle mediastinum which
appears separate from the trachea and esophagus. Question small
amount of pneumomediastinum of uncertain etiology. If there is any
clinical suspicion for esophageal injury, contrast swallow may be
indicated to further evaluate.

On the inferior-most images, there appears to be a small amount of
air in the porta hepatis region. This finding may warrant dedicated
CT abdomen study to further assess.

No adenopathy.  No aortic dissection.

## 2020-09-28 ENCOUNTER — Encounter: Payer: Self-pay | Admitting: Surgery

## 2020-09-29 ENCOUNTER — Other Ambulatory Visit: Payer: Self-pay | Admitting: Surgery

## 2020-09-29 ENCOUNTER — Other Ambulatory Visit (HOSPITAL_COMMUNITY): Payer: Self-pay | Admitting: Surgery

## 2020-10-07 ENCOUNTER — Ambulatory Visit (HOSPITAL_COMMUNITY): Payer: Medicare Other

## 2020-10-07 ENCOUNTER — Other Ambulatory Visit (HOSPITAL_COMMUNITY): Payer: Medicare Other

## 2020-10-14 ENCOUNTER — Ambulatory Visit (HOSPITAL_COMMUNITY)
Admission: RE | Admit: 2020-10-14 | Discharge: 2020-10-14 | Disposition: A | Discharge status: Home / Self Care | Payer: Medicare Other | Source: Ambulatory Visit | Attending: Surgery | Admitting: Surgery

## 2020-10-14 ENCOUNTER — Other Ambulatory Visit: Payer: Self-pay

## 2020-10-14 ENCOUNTER — Other Ambulatory Visit (HOSPITAL_COMMUNITY): Payer: Self-pay | Admitting: Surgery

## 2020-11-03 ENCOUNTER — Encounter: Payer: Medicare Other | Attending: Surgery | Admitting: Skilled Nursing Facility1

## 2020-11-03 ENCOUNTER — Other Ambulatory Visit: Payer: Self-pay

## 2020-11-03 DIAGNOSIS — E669 Obesity, unspecified: Secondary | ICD-10-CM | POA: Diagnosis present

## 2020-11-03 NOTE — Progress Notes (Signed)
Nutrition Assessment for Bariatric Surgery Medical Nutrition Therapy Appt Start Time: 11:15 am    End Time: 12:30pm  Patient was seen on 11/03/20 for Pre-Operative Nutrition Assessment. Letter of approval faxed to [facility] Surgery bariatric surgery program coordinator on 11/03/20.    Referral stated Supervised Weight Loss (SWL) visits needed: 6 months  Planned surgery: sleeve Pt expectation of surgery: lose weight, improve health specifically to increase endurance, breathe easier, decrease pain  Pt expectation of dietitian: none stated at this time    NUTRITION ASSESSMENT   Anthropometrics  Start weight at [facility]: 273.4 lbs (date: 11/03/20)  Height: 59 in BMI:  55.22kg/m2     Clinical  Medical hx: Pulmonary emboli, Downs Syndrome, narrow esophagus Medications: reviewed - azelastine nasal spray, montelukast, levocetirizine, levothyroxin, phentermine Labs: reviewed - did not see any recent  Notable signs/symptoms: none noted Any previous deficiencies? No  Micronutrient Nutrition Focused Physical Exam: Hair: No issues observed Eyes: No issues observed Mouth: No issues observed Neck: No issues observed Nails: No issues observed Skin: No issues observed  Lifestyle & Dietary Hx  Pt accompanied by supportive father. Pt is not currently physically active but she does like to dance.  She has access to a pool and stationary bike at her home spends her day laying in bed crocheting and watching television.  Pt reports having a narrow esophagus and already chews her food wel but does feel she needs to drink with meals. Pt does not like beans (unless her dad makes them), celery, brussels sprouts.  She does like sushi (not spicy).  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 She has used the Lose It app for logging foods which is liked.  She reports that she sometimes does not follow portion sizes when she really likes the  food and did have a toime where she was using [employer] a lot.  She has given up eating out [employer]) for [place] and plans to keep this change.  Pt states she really likes drinking out of her weight watchers cup which ahs a lid and straw so she can carry around the house with her.   24-Hr Dietary Recall First Meal (at 12pm and considers this brunch):  Chicken, minute rice cup of rice, parmesan cheese, spinach like a chipotle bowl Snack: none Second Meal:  none  Snack: (around 4pm): 1 flavored greek yogurt + 2 hard boiled eggs Third Meal (6:30pm): oven baked fish (1, 6 oz filet) & fistful of shrimp, 1/3 sweet kale salad kit, rice Snack ("dessert"): 1/2 cup fresh berries (all kinds)  & 20 - 24 nuts (almonds or walnuts) Beverages: water (2 weight watcher cups), true lemon energy drink, sparkling water, vitamin water zero, propel    Estimated Energy Needs Calories: 1500  NUTRITION DIAGNOSIS  Overweight/obesity ([place]-3.3) related to past poor dietary habits and physical inactivity as evidenced by patient w/ planned sleeve surgery following dietary guidelines for continued weight loss.    NUTRITION INTERVENTION  Nutrition counseling (C-1) and education (E-2) to facilitate bariatric surgery goals.   Pre-Op Goals Reviewed with the Patient . Track food and beverage intake (pen and paper, MyFitness Pal, Baritastic app, etc.) . Make healthy food choices while monitoring portion sizes . Consume 3 meals per day or try to eat every 3-5 hours . Avoid concentrated sugars and fried foods . Keep sugar & fat in the single digits per serving on food labels . Practice CHEWING your food (  aim for applesauce consistency) . Practice not drinking 15 minutes before, during, and 30 minutes after each meal and snack . Avoid all carbonated beverages (ex: soda, sparkling beverages)  . Limit caffeinated beverages (ex: coffee, tea, energy drinks) . Avoid all sugar-sweetened beverages (ex: regular soda, sports drinks)   . Avoid alcohol  . Aim for 64-100 ounces of FLUID daily (with at least half of fluid intake being plain water)  . Aim for at least 60-80 grams of PROTEIN daily . Look for a liquid protein source that contains ?15 g protein and ?5 g carbohydrate (ex: shakes, drinks, shots) . Make a list of non-food related activities . Physical activity is an important part of a healthy lifestyle so keep it moving! The goal is to reach 150 minutes of exercise per week, including cardiovascular and weight baring activity. . Dance when feeling perky . Ride on stationary bike while watching detective shows or grey's anatomy on tv Monday/wednesday/friday  . Make a chart to log exercise . Use regular true lemon (not energy) and switch to propel zero . Avoid sparkling water and other bubbly drinks . Email her food records here or take pictures of food intake . Replace phentermine with multivitamin  *Goals that are bolded indicate the pt would like to start working towards these  Handouts Provided Include  . Bariatric Surgery handouts (Nutrition Visits, Pre-Op Goals, Protein Shakes, Vitamins & Minerals)  Learning Style & Readiness for Change Teaching method utilized: Visual & Auditory  Demonstrated degree of understanding via: Teach Back  Readiness Level: contemplative Barriers to learning/adherence to lifestyle change: likes to drink water w/meals.  Likes to use straws  RD's Notes for Next Visit . Positive reinforcement, words of affirmation are helpful     MONITORING & EVALUATION Dietary intake, weekly physical activity, body weight, and pre-op goals reached at next nutrition visit.    Next Steps  Patient is to follow up at [facility] for Pre-Op Class >2 weeks before surgery for further nutrition education.  Return for next SWL

## 2020-12-07 ENCOUNTER — Other Ambulatory Visit: Payer: Self-pay

## 2020-12-07 ENCOUNTER — Encounter: Payer: Medicare Other | Attending: Surgery | Admitting: Skilled Nursing Facility1

## 2020-12-07 DIAGNOSIS — Z6841 Body Mass Index (BMI) 40.0 and over, adult: Secondary | ICD-10-CM | POA: Diagnosis not present

## 2020-12-07 DIAGNOSIS — E669 Obesity, unspecified: Secondary | ICD-10-CM | POA: Diagnosis not present

## 2020-12-07 NOTE — Progress Notes (Signed)
Supervised Weight Loss Visit Bariatric Nutrition Education  Planned Surgery: sleeve Pt Expectation of Surgery/ Goals: lose weight, improve health specifically to increase endurance, breathe easier, decrease pain   1 out of 6 SWL Appointments    NUTRITION ASSESSMENT   Anthropometrics  Start weight at [facility]: 273.4 lbs (date: 11/03/20)  Weight: 274 pounds Height: 59 in BMI:  55.34kg/m2     Clinical  Medical hx: Pulmonary emboli, Downs Syndrome, narrow esophagus Medications: reviewed - azelastine nasal spray, montelukast, levocetirizine, levothyroxin, phentermine Labs: reviewed - did not see any recent  Notable signs/symptoms: none noted Any previous deficiencies? No  Lifestyle & Dietary Hx  Pt has been sending dietitian a very detailed log of her exercise and food record including serving sizes. Pt has been riding her bike for 30 minutes 5 days a week and is excited to add swimming to her week on days she does not ride her bike.    Estimated daily fluid intake: 64 oz Supplements:  Current average weekly physical activity: 30 minutes 5 days a week  24-Hr Dietary Recall   NUTRITION DIAGNOSIS  Overweight/obesity ([place]-3.3) related to past poor dietary habits and physical inactivity as evidenced by patient w/ planned sleeve surgery following dietary guidelines for continued weight loss.   NUTRITION INTERVENTION  Nutrition counseling (C-1) and education (E-2) to facilitate bariatric surgery goals.  Pre-Op Goals Progress & New Goals . Continue: Dance when feeling perky . Continue: Ride on stationary bike while watching detective shows or grey's anatomy on tv Monday/wednesday/friday  . Continue: Make a chart to log exercise . Continue: Use regular true lemon (not energy) and switch to propel zero . Continue: Avoid sparkling water and other bubbly drinks . Continue: Email her food records here or take pictures of food intake . Continue: Replace phentermine with  multivitamin . Continue: Track food and beverage intake  . NEW: start swimming (Monday through Friday alternating with biking for activity)  Handouts Provided Include   N/A  Learning Style & Readiness for Change Teaching method utilized: Visual & Auditory  Demonstrated degree of understanding via: Teach Back  Readiness Level: Action Barriers to learning/adherence to lifestyle change: none identified   RD's Notes for next Visit  . Assess pts adherence to chosen goals    MONITORING & EVALUATION Dietary intake, weekly physical activity, body weight, and pre-op goals in 1 month.   Next Steps  Patient is to return to [facility] for next SWL

## 2020-12-13 ENCOUNTER — Telehealth: Payer: Self-pay | Admitting: Skilled Nursing Facility1

## 2020-12-13 NOTE — Telephone Encounter (Signed)
If you read the label and it says the serving size is 2 that means you take 2 every day. Does that help?  From: [email or web address]>  Sent: Friday, Dec 10, 2020 6:14 PM To: [email or web address]> Subject: Multivitamins  *Caution - External email - see footer for warnings* I just got my multiverse yesterday   I saw I supposed to take 2 daily   So is that each day I take 2   Or do I take more than 2 multivitamins

## 2021-01-06 ENCOUNTER — Other Ambulatory Visit: Payer: Self-pay

## 2021-01-06 ENCOUNTER — Encounter: Payer: Medicare Other | Attending: Surgery | Admitting: Skilled Nursing Facility1

## 2021-01-06 DIAGNOSIS — E669 Obesity, unspecified: Secondary | ICD-10-CM | POA: Diagnosis not present

## 2021-01-06 DIAGNOSIS — Z6841 Body Mass Index (BMI) 40.0 and over, adult: Secondary | ICD-10-CM | POA: Diagnosis not present

## 2021-01-06 NOTE — Progress Notes (Signed)
Supervised Weight Loss Visit Bariatric Nutrition Education  Planned Surgery: sleeve Pt Expectation of Surgery/ Goals: lose weight, improve health specifically to increase endurance, breathe easier, decrease pain   2 out of 6 SWL Appointments    NUTRITION ASSESSMENT   Anthropometrics  Start weight at [facility]: 273.4 lbs (date: 11/03/20)  Weight: 274.6 pounds Height: 59 in BMI:  55.46 kg/m2     Clinical  Medical hx: Pulmonary emboli, Downs Syndrome, narrow esophagus Medications: reviewed - azelastine nasal spray, montelukast, levocetirizine, levothyroxin, phentermine Labs: reviewed - did not see any recent  Notable signs/symptoms: none noted Any previous deficiencies? No  Lifestyle & Dietary Hx  Pt states she has had plenty of surgeries before so she is not worried about having another surgery.  Pt has been sending dietitian a very detailed log of her exercise and food record including serving sizes. Pt has been riding her bike for 30 minutes 3 days a week and swimming 2 days a week.   Pt states it hurts her knees to increase intensity on her bike however with help from her mom she did agree to do one half spin up on the resistance.   Wakes 12pm in bed 3-4am  Last meal 6-7pm and a snack at 9-10pm  Plans to have more energy and get a job after she has the bariatric surgery.   Chose to work on not drinking before meals and will add not drinking after meals at next appt as well as moistening food methods to help with chocking worries.   Estimated daily fluid intake: 64 oz Supplements: multivitamin  Current average weekly physical activity: 30 minutes 3 days a week biking, 2 days a week swimming  24-Hr Dietary Recall    NUTRITION DIAGNOSIS  Overweight/obesity ([place]-3.3) related to past poor dietary habits and physical inactivity as evidenced by patient w/ planned sleeve surgery following dietary guidelines for continued weight loss.   NUTRITION INTERVENTION  Nutrition  counseling (C-1) and education (E-2) to facilitate bariatric surgery goals.  Pre-Op Goals Progress & New Goals Continue: Dance when feeling perky Continue: Ride on stationary bike while watching detective shows or grey's anatomy on tv Monday/wednesday/friday  Continue: Make a chart to log exercise Continue: Use regular true lemon (not energy) and switch to propel zero Continue: Avoid sparkling water and other bubbly drinks Continue: Email her food records here or take pictures of food intake Continue: Replace phentermine with multivitamin Continue: Track food and beverage intake  continue: start swimming (Monday through Friday alternating with biking for activity) NEW: Keep the bariatric multivitamins but stop taking them; start taking a general multivitamin  NEW: start taking a calcium such as tums 2 hours from your multivitamin, such as waiting to take your calcium with dinner NEW: Do not drink 15 minutes before you are going to eat any meal or snack, try setting an alarm  NEW: Turn your bike resistance up  Handouts Provided Include  N/A  Learning Style & Readiness for Change Teaching method utilized: Visual & Auditory  Demonstrated degree of understanding via: Teach Back  Readiness Level: Action Barriers to learning/adherence to lifestyle change: none identified   RD's Notes for next Visit  Assess pts adherence to chosen goals    MONITORING & EVALUATION Dietary intake, weekly physical activity, body weight, and pre-op goals in 1 month.   Next Steps  Patient is to return to [facility] for next SWL

## 2021-02-03 ENCOUNTER — Other Ambulatory Visit: Payer: Self-pay

## 2021-02-03 ENCOUNTER — Encounter: Payer: Medicare Other | Attending: Surgery | Admitting: Skilled Nursing Facility1

## 2021-02-03 DIAGNOSIS — Z713 Dietary counseling and surveillance: Secondary | ICD-10-CM | POA: Insufficient documentation

## 2021-02-03 DIAGNOSIS — Q909 Down syndrome, unspecified: Secondary | ICD-10-CM | POA: Insufficient documentation

## 2021-02-03 DIAGNOSIS — Z86711 Personal history of pulmonary embolism: Secondary | ICD-10-CM | POA: Insufficient documentation

## 2021-02-03 DIAGNOSIS — Z6841 Body Mass Index (BMI) 40.0 and over, adult: Secondary | ICD-10-CM | POA: Insufficient documentation

## 2021-02-03 DIAGNOSIS — E669 Obesity, unspecified: Secondary | ICD-10-CM | POA: Insufficient documentation

## 2021-02-03 NOTE — Progress Notes (Signed)
Supervised Weight Loss Visit Bariatric Nutrition Education  Planned Surgery: sleeve Pt Expectation of Surgery/ Goals: lose weight, improve health specifically to increase endurance, breathe easier, decrease pain   3 out of 6 SWL Appointments    NUTRITION ASSESSMENT   Anthropometrics  Start weight at [facility]: 273.4 lbs (date: 11/03/20)  Weight: 275 pounds Height: 59 in BMI:  55.60 kg/m2     Clinical  Medical hx: Pulmonary emboli, Downs Syndrome, narrow esophagus Medications: reviewed - azelastine nasal spray, montelukast, levocetirizine, levothyroxin Labs: reviewed - did not see any recent  Notable signs/symptoms: none noted Any previous deficiencies? No  Lifestyle & Dietary Hx  Pt states she has had plenty of surgeries before so she is not worried about having another surgery.  Pt has been sending dietitian a very detailed log of her exercise and food record including serving sizes. Pt has been riding her bike for 30 minutes 3 days a week and swimming 2 days a week.    Wakes 12pm in bed 3-4am  Last meal 6-7pm and a snack at 9-10pm  Plans to have more energy and get a job after she has the bariatric surgery.   Pt states she has not been drinking before meals and has not felt chocked up with her foods.already not drinking with meals before or 30 minutes after. Pt states her roommate helps her to fill her water bottle so is supportive.   Pt does not wish to increase her bike resistance to 2 this time but states for the next appt she will think about it.    Estimated daily fluid intake: 64 oz Supplements: multivitamin  Current average weekly physical activity: 30 minutes 3 days a week biking, 2 days a week swimming  24-Hr Dietary Recall    NUTRITION DIAGNOSIS  Overweight/obesity ([place]-3.3) related to past poor dietary habits and physical inactivity as evidenced by patient w/ planned sleeve surgery following dietary guidelines for continued weight loss.   NUTRITION  INTERVENTION  Nutrition counseling (C-1) and education (E-2) to facilitate bariatric surgery goals.  Pre-Op Goals Progress & New Goals Continue: Dance when feeling perky Continue: Ride on stationary bike while watching detective shows or grey's anatomy on tv Monday/wednesday/friday  Continue: Make a chart to log exercise Continue: Use regular true lemon (not energy) and switch to propel zero Continue: Avoid sparkling water and other bubbly drinks Continue: Email her food records here or take pictures of food intake Continue: Replace phentermine with multivitamin Continue: Track food and beverage intake  continue: start swimming (Monday through Friday alternating with biking for activity) Continue: Keep the bariatric multivitamins but stop taking them; start taking a general multivitamin  Continue: start taking a calcium such as tums 2 hours from your multivitamin, such as waiting to take your calcium with dinner Continue: Do not drink 15 minutes before you are going to eat any meal or snack, try setting an alarm  continue: Turn your bike resistance up NEW: one calcium with afternoon snack and one calcium with supper NEW: find 3 flavors of protein shakes you like and even get an unflavored protein powder following 15 grams of protein or more and 5 grams of carbohydrates or less  Handouts Provided Include  N/A  Learning Style & Readiness for Change Teaching method utilized: Visual & Auditory  Demonstrated degree of understanding via: Teach Back  Readiness Level: Action Barriers to learning/adherence to lifestyle change: none identified   RD's Notes for next Visit  Assess pts adherence to chosen goals  MONITORING & EVALUATION Dietary intake, weekly physical activity, body weight, and pre-op goals in 1 month.   Next Steps  Patient is to return to [facility] for next SWL

## 2021-03-10 ENCOUNTER — Encounter: Payer: Medicare Other | Attending: Surgery | Admitting: Skilled Nursing Facility1

## 2021-03-10 ENCOUNTER — Other Ambulatory Visit: Payer: Self-pay

## 2021-03-10 DIAGNOSIS — Q909 Down syndrome, unspecified: Secondary | ICD-10-CM | POA: Insufficient documentation

## 2021-03-10 DIAGNOSIS — Z713 Dietary counseling and surveillance: Secondary | ICD-10-CM | POA: Diagnosis not present

## 2021-03-10 DIAGNOSIS — Z6841 Body Mass Index (BMI) 40.0 and over, adult: Secondary | ICD-10-CM | POA: Insufficient documentation

## 2021-03-10 DIAGNOSIS — Z86711 Personal history of pulmonary embolism: Secondary | ICD-10-CM | POA: Insufficient documentation

## 2021-03-10 DIAGNOSIS — E669 Obesity, unspecified: Secondary | ICD-10-CM | POA: Insufficient documentation

## 2021-03-10 NOTE — Progress Notes (Signed)
Supervised Weight Loss Visit Bariatric Nutrition Education  Planned Surgery: sleeve Pt Expectation of Surgery/ Goals: lose weight, improve health specifically to increase endurance, breathe easier, decrease pain   4 out of 6 SWL Appointments    NUTRITION ASSESSMENT   Anthropometrics  Start weight at [facility]: 273.4 lbs (date: 11/03/20)  Weight: 277 pounds Height: 59 in BMI:  55.05 kg/m2     Clinical  Medical hx: Pulmonary emboli, Downs Syndrome, narrow esophagus Medications: reviewed - azelastine nasal spray, montelukast, levocetirizine, levothyroxin Labs: reviewed - did not see any recent  Notable signs/symptoms: none noted Any previous deficiencies? No  Lifestyle & Dietary Hx  Pt states she has had plenty of surgeries before so she is not worried about having another surgery.  Pt has been sending dietitian a very detailed log of her exercise and food record including serving sizes. Pt has been riding her bike for 30 minutes 3 days a week and swimming 2 days a week.    Wakes 11 am in bed 3-4am  Last meal 6-7pm and a snack at 9-10pm  Plans to have more energy and get a job after she has the bariatric surgery.   Pt states she has not been drinking before meals and has not felt chocked up with her foods.already not drinking with meals before or 30 minutes after. Pt states her roommate helps her to fill her water bottle so is supportive.     Pt states she likes 4 protein shakes.  Has been doing dance videos 2 days a week now that her pool is closed.  Pt states she wakes up at 11 instead of 12 now.    Estimated daily fluid intake: 64 oz Supplements: multivitamin  Current average weekly physical activity: 30 minutes 3 days a week biking increased resistance to 3 and wants to increase to 4, 2 days a youtube dance videos  24-Hr Dietary Recall    NUTRITION DIAGNOSIS  Overweight/obesity ([place]-3.3) related to past poor dietary habits and physical inactivity as evidenced by patient  w/ planned sleeve surgery following dietary guidelines for continued weight loss.   NUTRITION INTERVENTION  Nutrition counseling (C-1) and education (E-2) to facilitate bariatric surgery goals.  Pre-Op Goals Progress & New Goals Continue: Dance when feeling perky Continue: Ride on stationary bike while watching detective shows or grey's anatomy on tv Monday/wednesday/friday  Continue: Make a chart to log exercise Continue: Use regular true lemon (not energy) and switch to propel zero Continue: Avoid sparkling water and other bubbly drinks Continue: Email her food records here or take pictures of food intake Continue: Replace phentermine with multivitamin Continue: Track food and beverage intake  continue: start swimming (Monday through Friday alternating with biking for activity) Continue: Keep the bariatric multivitamins but stop taking them; start taking a general multivitamin  Continue: start taking a calcium such as tums 2 hours from your multivitamin, such as waiting to take your calcium with dinner Continue: Do not drink 15 minutes before you are going to eat any meal or snack, try setting an alarm  continue: Turn your bike resistance up continue: one calcium with afternoon snack and one calcium with supper continue: find 3 flavors of protein shakes you like and even get an unflavored protein powder following 15 grams of protein or more and 5 grams of carbohydrates or less  Handouts Provided Include  N/A  Learning Style & Readiness for Change Teaching method utilized: Visual & Auditory  Demonstrated degree of understanding via: Teach Back  Readiness Level:  Action Barriers to learning/adherence to lifestyle change: none identified   RD's Notes for next Visit  Assess pts adherence to chosen goals    MONITORING & EVALUATION Dietary intake, weekly physical activity, body weight, and pre-op goals in 1 month.   Next Steps  Patient is to return to [facility] for next SWL

## 2021-04-07 ENCOUNTER — Encounter: Payer: Medicare Other | Attending: Surgery | Admitting: Skilled Nursing Facility1

## 2021-04-07 ENCOUNTER — Other Ambulatory Visit: Payer: Self-pay

## 2021-04-07 DIAGNOSIS — Z6841 Body Mass Index (BMI) 40.0 and over, adult: Secondary | ICD-10-CM | POA: Insufficient documentation

## 2021-04-07 DIAGNOSIS — Z86711 Personal history of pulmonary embolism: Secondary | ICD-10-CM | POA: Diagnosis not present

## 2021-04-07 DIAGNOSIS — Q909 Down syndrome, unspecified: Secondary | ICD-10-CM | POA: Diagnosis not present

## 2021-04-07 DIAGNOSIS — E669 Obesity, unspecified: Secondary | ICD-10-CM | POA: Insufficient documentation

## 2021-04-07 NOTE — Progress Notes (Signed)
Supervised Weight Loss Visit Bariatric Nutrition Education  Planned Surgery: sleeve Pt Expectation of Surgery/ Goals: lose weight, improve health specifically to increase endurance, breathe easier, decrease pain   5 out of 6 SWL Appointments    NUTRITION ASSESSMENT   Anthropometrics  Start weight at [facility]: 273.4 lbs (date: 11/03/20)  Weight: 281.6 pounds Height: 59 in BMI:  56.88 kg/m2     Clinical  Medical hx: Pulmonary emboli, Downs Syndrome, narrow esophagus Medications: reviewed - azelastine nasal spray, montelukast, levocetirizine, levothyroxin Labs: reviewed - did not see any recent  Notable signs/symptoms: none noted Any previous deficiencies? No  Lifestyle & Dietary Hx  Pt states she has had plenty of surgeries before so she is not worried about having another surgery.  Pt has been sending dietitian a very detailed log of her exercise and food record including serving sizes. Pt has been riding her bike for 30 minutes 3 days a week and swimming 2 days a week.    Wakes 11 am in bed 3-4am  Last meal 6-7pm and a snack at 9-10pm  Plans to have more energy and get a job after she has the bariatric surgery.   Pt states she has not been drinking before meals and has not felt chocked up with her foods.already not drinking with meals before or 30 minutes after. Pt states her roommate helps her to fill her water bottle so is supportive.     Pt states she likes 4 protein shakes.  Has been doing dance videos 2 days a week now that her pool is closed.  Pt states she wakes up at 11 instead of 12 now.    Pt states she believes her couple pounds weight gain to be due to her upcoming menstrual cycle.  Pts mother states she will be riding for an hour and half after the surgery to get to their house: Dietitian advised to speak about this with surgeon due to blood clot risk post surgery.   Estimated daily fluid intake: 64 oz Supplements: multivitamin  Current average weekly  physical activity: 30 minutes 3 days a week biking increased resistance to 3 and wants to increase to 4, 2 days a youtube dance videos  24-Hr Dietary Recall    NUTRITION DIAGNOSIS  Overweight/obesity ([place]-3.3) related to past poor dietary habits and physical inactivity as evidenced by patient w/ planned sleeve surgery following dietary guidelines for continued weight loss.   NUTRITION INTERVENTION  Nutrition counseling (C-1) and education (E-2) to facilitate bariatric surgery goals.  Pre-Op Goals Progress & New Goals Continue: Dance when feeling perky Continue: Ride on stationary bike while watching detective shows or grey's anatomy on tv Monday/wednesday/friday  Continue: Make a chart to log exercise Continue: Use regular true lemon (not energy) and switch to propel zero Continue: Avoid sparkling water and other bubbly drinks Continue: Email her food records here or take pictures of food intake Continue: Replace phentermine with multivitamin Continue: Track food and beverage intake  continue: start swimming (Monday through Friday alternating with biking for activity) Continue: Keep the bariatric multivitamins but stop taking them; start taking a general multivitamin  Continue: start taking a calcium such as tums 2 hours from your multivitamin, such as waiting to take your calcium with dinner Continue: Do not drink 15 minutes before you are going to eat any meal or snack, try setting an alarm  continue: Turn your bike resistance up continue: one calcium with afternoon snack and one calcium with supper continue: find 3 flavors of  protein shakes you like and even get an unflavored protein powder following 15 grams of protein or more and 5 grams of carbohydrates or less  Handouts Provided Include  N/A  Learning Style & Readiness for Change Teaching method utilized: Visual & Auditory  Demonstrated degree of understanding via: Teach Back  Readiness Level: Action Barriers to  learning/adherence to lifestyle change: none identified   RD's Notes for next Visit  Assess pts adherence to chosen goals    MONITORING & EVALUATION Dietary intake, weekly physical activity, body weight, and pre-op goals in 1 month.   Next Steps  Patient is to return to [facility] for next SWL

## 2021-05-09 ENCOUNTER — Encounter: Payer: Medicare Other | Attending: Surgery | Admitting: Skilled Nursing Facility1

## 2021-05-09 ENCOUNTER — Other Ambulatory Visit: Payer: Self-pay

## 2021-05-09 DIAGNOSIS — Z6841 Body Mass Index (BMI) 40.0 and over, adult: Secondary | ICD-10-CM | POA: Diagnosis not present

## 2021-05-09 DIAGNOSIS — E669 Obesity, unspecified: Secondary | ICD-10-CM | POA: Diagnosis not present

## 2021-05-09 NOTE — Progress Notes (Signed)
Supervised Weight Loss Visit Bariatric Nutrition Education  Planned Surgery: sleeve Pt Expectation of Surgery/ Goals: lose weight, improve health specifically to increase endurance, breathe easier, decrease pain   6 out of 6 SWL Appointments    Pt completed visits.   Pt has cleared nutrition requirements.    NUTRITION ASSESSMENT   Anthropometrics  Start weight at [facility]: 273.4 lbs (date: 11/03/20)  Weight: 282 pounds Height: 59 in BMI:  57.06 kg/m2     Clinical  Medical hx: Pulmonary emboli, Downs Syndrome, narrow esophagus Medications: reviewed - azelastine nasal spray, montelukast, levocetirizine, levothyroxin Labs: reviewed - did not see any recent  Notable signs/symptoms: none noted Any previous deficiencies? No  Lifestyle & Dietary Hx  Pt states she has had plenty of surgeries before so she is not worried about having another surgery.  Pt has been sending dietitian a very detailed log of her exercise and food record including serving sizes.    Wakes 11 am in bed 3-4am  Last meal 6-7pm and a snack at 9-10pm  Plans to have more energy and get a job after she has the bariatric surgery.   Pt states she has not been drinking before meals and has not felt chocked up with her foods.already not drinking with meals before or 30 minutes after. Pt states her roommate helps her to fill her water bottle so is supportive.     Pt states she likes 4 protein shakes.  Has been doing dance videos 2 days a week now that her pool is closed.  Pt states she wakes up at 11 instead of 12 now.    Pt states she believes her couple pounds weight gain to be due to her current menstrual cycle: continued    Estimated daily fluid intake: 64 oz Supplements: multivitamin  Current average weekly physical activity: 30 minutes 3 days a week biking increased resistance to 3 and wants to increase to 4, 2 days a youtube dance videos  24-Hr Dietary Recall    NUTRITION DIAGNOSIS   Overweight/obesity ([place]-3.3) related to past poor dietary habits and physical inactivity as evidenced by patient w/ planned sleeve surgery following dietary guidelines for continued weight loss.   NUTRITION INTERVENTION  Nutrition counseling (C-1) and education (E-2) to facilitate bariatric surgery goals.  Pre-Op Goals Progress & New Goals Continue: Dance when feeling perky Continue: Ride on stationary bike while watching detective shows or grey's anatomy on tv Monday/wednesday/friday  Continue: Make a chart to log exercise Continue: Use regular true lemon (not energy) and switch to propel zero Continue: Avoid sparkling water and other bubbly drinks Continue: Email her food records here or take pictures of food intake Continue: Replace phentermine with multivitamin Continue: Track food and beverage intake  continue: start swimming (Monday through Friday alternating with biking for activity) Continue: Keep the bariatric multivitamins but stop taking them; start taking a general multivitamin  Continue: start taking a calcium such as tums 2 hours from your multivitamin, such as waiting to take your calcium with dinner Continue: Do not drink 15 minutes before you are going to eat any meal or snack, try setting an alarm  continue: Turn your bike resistance up continue: one calcium with afternoon snack and one calcium with supper continue: find 3 flavors of protein shakes you like and even get an unflavored protein powder following 15 grams of protein or more and 5 grams of carbohydrates or less  Handouts Provided Include  N/A  Learning Style & Readiness for Change Teaching method  utilized: Special educational needs teacher  Demonstrated degree of understanding via: Teach Back  Readiness Level: Action Barriers to learning/adherence to lifestyle change: none identified   RD's Notes for next Visit  Assess pts adherence to chosen goals    MONITORING & EVALUATION Dietary intake, weekly physical activity,  body weight, and pre-op goals   Next Steps  Patient is to return to [facility] for pre-op class Pt has completed visits. No further supervised visits required/recomended

## 2021-05-27 ENCOUNTER — Ambulatory Visit (INDEPENDENT_AMBULATORY_CARE_PROVIDER_SITE_OTHER): Payer: Medicare Other | Admitting: Psychology

## 2021-06-10 ENCOUNTER — Ambulatory Visit (INDEPENDENT_AMBULATORY_CARE_PROVIDER_SITE_OTHER): Payer: Medicare Other | Admitting: Psychology

## 2021-08-04 ENCOUNTER — Other Ambulatory Visit: Payer: Self-pay

## 2021-08-04 ENCOUNTER — Encounter: Payer: Medicare Other | Attending: Surgery | Admitting: Skilled Nursing Facility1

## 2021-08-04 DIAGNOSIS — Z6841 Body Mass Index (BMI) 40.0 and over, adult: Secondary | ICD-10-CM | POA: Diagnosis not present

## 2021-08-04 DIAGNOSIS — E669 Obesity, unspecified: Secondary | ICD-10-CM | POA: Insufficient documentation

## 2021-08-04 DIAGNOSIS — Z713 Dietary counseling and surveillance: Secondary | ICD-10-CM | POA: Insufficient documentation

## 2021-08-04 NOTE — Progress Notes (Signed)
Pre-Operative Nutrition    Patient was seen on 08/04/2021 for Pre-Operative Bariatric Surgery Education at [facility].    Pt arrives with her supportive parents.   Surgery date:  Surgery type: sleeve gastrectomy  Start weight at [facility]: 273 pounds Weight today: 283 pounds  The following the learning objectives were met by the patient during this course: Identify Pre-Op Dietary Goals and will begin 2 weeks pre-operatively Identify appropriate sources of fluids and proteins  State protein recommendations and appropriate sources pre and post-operatively Identify Post-Operative Dietary Goals and will follow for 2 weeks post-operatively Identify appropriate multivitamin and calcium sources Describe the need for physical activity post-operatively and will follow MD recommendations State when to call healthcare provider regarding medication questions or post-operative complications When having a diagnosis of diabetes understanding hypoglycemia symptoms and the inclusion of 1 complex carbohydrate per meal  Handouts given during class include: Pre-Op Bariatric Surgery Diet Handout Protein Shake Handout Post-Op Bariatric Surgery Nutrition Handout BELT Program Information Flyer Support Group Information Flyer [facility] Bariatric Supplements Price List  Follow-Up Plan: Patient will follow-up at [facility] 2 weeks post operatively for diet advancement per MD.

## 2021-08-05 ENCOUNTER — Ambulatory Visit: Payer: Self-pay | Admitting: Surgery

## 2021-08-05 NOTE — H&P (Signed)
Angela Odom [id number]    Referring Provider:  Self     Subjective    Chief Complaint: pre-op bariatric surgery       History of Present Illness: Returns for follow-up regarding surgical treatment of morbid obesity.  She has completed the bariatric pathway with no barriers identified and is tentatively scheduled for surgery at the end of February.  She has not had any changes in her health since our initial meeting 10 months ago.  She and her family have a few questions to discuss today. CXR/UGI: neg/ no hh Dietician approved Psychology approved   09/28/20: 36 year old woman with a history of pulmonary emboli in 2017 (was on OCPs and this occurred following a trans-Continental flight), pseudocholinesterase deficiency, postop nausea and vomiting, Down's syndrome, conversion disorder, chronic bronchitis and previous episodes of pneumonia, no prior abdominal surgeries who presents for consultation regarding surgical management of morbid obesity.  She is referred by Angela Odom at [facility].  She has been struggling with this for essentially her entire life.  She has tried numerous diets, exercise plans and medications.  Most recently she started on phentermine.  This has stabilized her weight but she is not losing with this.  Does report that it makes her jittery.  Working with a Health and safety inspector and reports that she has learned quite a lot and her mother states that she is incredibly compliant with the recommended eating style but just does not lose weight.  This is affecting her mobility, and her ability to work.  She completed the beyond academics program at [employer], lives here in town with her roommate, and her family lives in [place] and are very involved and supportive.  She has not been able to work because she is hardly able to do anything due to her weight, she is not able to travel with her family that she cannot keep up, and she does recognize that her weight is a problem.  Her  goal is to improve her health and to avoid developing complications of obesity. Denies any history of GERD/reflux, or problems with constipation/bowel movements. 266.5lb/ BMI 56   Review of Systems: A complete review of systems was obtained from the patient.  I have reviewed this information and discussed as appropriate with the patient.  See HPI as well for other ROS.     Medical History: Past Medical History      Past Medical History:  Diagnosis Date   Pulmonary embolus (CMS-HCC)     Thyroid disease          There is no problem list on file for this patient.     Past Surgical History       Past Surgical History:  Procedure Laterality Date   eye muscle surgery N/A     HALLUX VALGUS CORRECTION N/A     TONSILLECTOMY & ADENOIDECTOMY N/A          Allergies       Allergies  Allergen Reactions   Succinylcholine Other (See Comments)      Patient has pseudocholinesterase deficiency- took 5 hours to metabolize succ after eye surgery.  (This was 1991)   Cefaclor Hives   Other Hives              Current Outpatient Medications on File Prior to Visit  Medication Sig Dispense Refill   acetaminophen-codeine (TYLENOL #3) 300-30 mg tablet acetaminophen 300 mg-codeine 30 mg tablet  TAKE 1 TABLET BY MOUTH EVERY 6 HOURS FOR 3 DAYS  azelastine (ASTELIN) 137 mcg nasal spray azelastine 137 mcg (0.1 %) nasal spray aerosol       ergocalciferol, vitamin D2, 1,250 mcg (50,000 unit) capsule Take 50,000 Units by mouth once a week       levocetirizine (XYZAL) 5 MG tablet levocetirizine 5 mg tablet  TAKE 1 TABLET BY MOUTH ONCE DAILY IN THE EVENING FOR 30 DAYS       levothyroxine (SYNTHROID) 100 MCG tablet TAKE 1 TABLET BY MOUTH ONCE DAILY AT BEDTIME ON AN EMPTY STOMACH       montelukast (SINGULAIR) 10 mg tablet montelukast 10 mg tablet  TAKE 1 TABLET BY MOUTH IN THE EVENING        No current facility-administered medications on file prior to visit.      Family History       Family  History  Problem Relation Age of Onset   Hyperlipidemia (Elevated cholesterol) Father          Social History       Tobacco Use  Smoking Status Never  Smokeless Tobacco Never      Social History  Social History        Socioeconomic History   Marital status: Single  Tobacco Use   Smoking status: Never   Smokeless tobacco: Never  Vaping Use   Vaping Use: Never used  Substance and Sexual Activity   Alcohol use: Never   Drug use: Never        Objective:         Vitals:    08/04/21 1147  BP: (!) 140/88  Pulse: 101  Temp: 36.8 C (98.3 F)  SpO2: 97%  Weight: (!) 128.7 kg (283 lb 12.8 oz)  Height: 149.9 cm (4\' 11" )    Body mass index is 57.32 kg/m.   Alert and well-appearing Unlabored respirations       Assessment and Plan:  Diagnoses and all orders for this visit:   Morbid obesity (CMS-HCC)     She remains a candidate for sleeve gastrectomy. We have previously discussed the surgery including technical aspects, the risks of bleeding, infection, pain, scarring, injury to intra-abdominal structures, staple line leak or abscess, chronic abdominal pain or nausea, new onset or worsened GERD, DVT/PE, pneumonia, heart attack, stroke, death, failure to reach weight loss goals and weight regain, hernia. Discussed the typical peri-, and postoperative course. Discussed the importance of lifelong behavioral changes to combat the chronic and relapsing disease which is obesity. Questions welcomed and answered. The patient appears to have an adequate understanding of the proposed procedure and risks/benefits.  Her parents are here with her today.  We will plan to proceed as scheduled next month. HYPOTHYROID (E03.9) Story: Recently started on Synthroid, followed by PCP PSEUDOCHOLINESTERASE DEFICIENCY (E88.09) Story: Cannot metabolize succinylcholine, I put this as an allergy in Epic HISTORY OF PULMONARY EMBOLUS (PE) (Z86.711) Story: Provoked, no longer on anticoagulation    Angela Odom , MD

## 2021-09-08 NOTE — Progress Notes (Signed)
Covid test on 09/15/21  Come thriu main entrance at [facility] have a seat in the lobby on the right as you come thru the door.  Call [phone] and let them know you are here for covid testing .    Your procedure is scheduled on:                   09/19/21   Report to [facility] Main  Entrance   Report to admitting at   [phone]     Call this number if you have problems the morning of surgery [phone]    REMEMBER: NO  SOLID FOOD CANDY OR GUM AFTER MIDNIGHT. CLEAR LIQUIDS UNTIL     0415am      . NOTHING BY MOUTH EXCEPT CLEAR LIQUIDS UNTIL  0415am   . PLEASE FINISH ENSURE DRINK PER SURGEON ORDER  WHICH NEEDS TO BE COMPLETED AT   0415am   .      CLEAR LIQUID DIET   Foods Allowed                                                                    Coffee and tea, regular and decaf                            Fruit ices (not with fruit pulp)                                      Iced Popsicles                                    Carbonated beverages, regular and diet                                    Cranberry, grape and apple juices Sports drinks like Gatorade Lightly seasoned clear broth or consume(fat free) Sugar, honey syrup ___________________________________________________________________      BRUSH YOUR TEETH MORNING OF SURGERY AND RINSE YOUR MOUTH OUT, NO CHEWING GUM CANDY OR MINTS.     Take these medicines the morning of surgery with A SIP OF WATER:  none   DO NOT TAKE ANY DIABETIC MEDICATIONS DAY OF YOUR SURGERY                               You may not have any metal on your body including hair pins and              piercings  Do not wear jewelry, make-up, lotions, powders or perfumes, deodorant             Do not wear nail polish on your fingernails.  Do not shave  48 hours prior to surgery.              Men may shave face and neck.   Do not bring valuables to the hospital. [facility] IS NOT  RESPONSIBLE   FOR VALUABLES.  Contacts, dentures  or bridgework may not be worn into surgery.  Leave suitcase in the car. After surgery it may be brought to your room.     Patients discharged the day of surgery will not be allowed to drive home. IF YOU ARE HAVING SURGERY AND GOING HOME THE SAME DAY, YOU MUST HAVE AN ADULT TO DRIVE YOU HOME AND BE WITH YOU FOR 24 HOURS. YOU MAY GO HOME BY TAXI OR UBER OR ORTHERWISE, BUT AN ADULT MUST ACCOMPANY YOU HOME AND STAY WITH YOU FOR 24 HOURS.  Name and phone number of your driver:  Special Instructions: N/A              Please read over the following fact sheets you were given: _____________________________________________________________________  [facility] - Preparing for Surgery Before surgery, you can play an important role.  Because skin is not sterile, your skin needs to be as free of germs as possible.  You can reduce the number of germs on your skin by washing with CHG (chlorahexidine gluconate) soap before surgery.  CHG is an antiseptic cleaner which kills germs and bonds with the skin to continue killing germs even after washing. Please DO NOT use if you have an allergy to CHG or antibacterial soaps.  If your skin becomes reddened/irritated stop using the CHG and inform your nurse when you arrive at Short Stay. Do not shave (including legs and underarms) for at least 48 hours prior to the first CHG shower.  You may shave your face/neck. Please follow these instructions carefully:  1.  Shower with CHG Soap the night before surgery and the  morning of Surgery.  2.  If you choose to wash your hair, wash your hair first as usual with your  normal  shampoo.  3.  After you shampoo, rinse your hair and body thoroughly to remove the  shampoo.                           4.  Use CHG as you would any other liquid soap.  You can apply chg directly  to the skin and wash                       Gently with a scrungie or clean washcloth.  5.  Apply the CHG Soap to your body ONLY FROM THE NECK DOWN.   Do not use  on face/ open                           Wound or open sores. Avoid contact with eyes, ears mouth and genitals (private parts).                       Wash face,  Genitals (private parts) with your normal soap.             6.  Wash thoroughly, paying special attention to the area where your surgery  will be performed.  7.  Thoroughly rinse your body with warm water from the neck down.  8.  DO NOT shower/wash with your normal soap after using and rinsing off  the CHG Soap.                9.  Pat yourself dry with a clean towel.            10.  Wear clean pajamas.            11.  Place clean sheets on your bed the night of your first shower and do not  sleep with pets. Day of Surgery : Do not apply any lotions/deodorants the morning of surgery.  Please wear clean clothes to the hospital/surgery center.  FAILURE TO FOLLOW THESE INSTRUCTIONS MAY RESULT IN THE CANCELLATION OF YOUR SURGERY PATIENT SIGNATURE_________________________________  NURSE SIGNATURE__________________________________  ________________________________________________________________________

## 2021-09-08 NOTE — Progress Notes (Addendum)
Anesthesia Review:  PCP: DR Horald Pollen  Cardiologist : none  Neuro- none  Chest x-ray : 2v-10/15/20  EKG : 10/14/20  Echo : Stress test: Cardiac Cath :  Activity level: can do a flight of stairs withouit difficulty  Sleep Study/ CPAP : none  Fasting Blood Sugar :      / Checks Blood Sugar -- times a day:   Blood Thinner/ Instructions /Last Dose: ASA / Instructions/ Last Dose :   Father reports pt has " little spells" with too much stimulation.  Not followed by neurology.   Difficult stick- Lab unable to draw blood at time of preop appt.  Called and spoke with Abigail Butts at [employer] .  Abigail Butts to call office of DR Horald Pollen to see if they can obtain labs prior to surgery . Father reports lab at [place] office is always able to draw blood on pt.  Abigail Butts at [place] to have DR Darron Doom office fax [place] CBC/DIFF and CMp once completed.   Abigail Butts called back and stated labs would be done on 09/14/21 at [facility] to call pt and let her know to be at [place] office at 1000am on 2/.22/23.   PT has P[seudocholinesterase Deficiency .  Listed in allergies. Diagnosed with eye surgery in 1991.  Eye surgery was  done  at [facility] in [place] per father who was present at preop appt.   Mother also has same diagnosis.   Covid test on 09/15/21 at 0930am  Labs were to be drawn today on 09/14/21.  Called office at 1530 pm and [facility] for nurse and requested labs be faxed to [phone].  Left call back number of [phone].

## 2021-09-09 ENCOUNTER — Other Ambulatory Visit: Payer: Self-pay

## 2021-09-09 ENCOUNTER — Encounter (HOSPITAL_COMMUNITY): Payer: Self-pay

## 2021-09-09 ENCOUNTER — Encounter (HOSPITAL_COMMUNITY)
Admission: RE | Admit: 2021-09-09 | Discharge: 2021-09-09 | Disposition: A | Discharge status: Home / Self Care | Payer: Medicare Other | Source: Ambulatory Visit | Attending: Surgery | Admitting: Surgery

## 2021-09-09 VITALS — BP 151/77 | HR 56 | Temp 97.7°F | Resp 16 | Ht 59.0 in | Wt 276.0 lb

## 2021-09-09 DIAGNOSIS — Z01818 Encounter for other preprocedural examination: Secondary | ICD-10-CM | POA: Insufficient documentation

## 2021-09-09 DIAGNOSIS — Z6841 Body Mass Index (BMI) 40.0 and over, adult: Secondary | ICD-10-CM | POA: Diagnosis not present

## 2021-09-09 DIAGNOSIS — Z86711 Personal history of pulmonary embolism: Secondary | ICD-10-CM | POA: Diagnosis not present

## 2021-09-09 DIAGNOSIS — E8809 Other disorders of plasma-protein metabolism, not elsewhere classified: Secondary | ICD-10-CM | POA: Insufficient documentation

## 2021-09-09 DIAGNOSIS — E039 Hypothyroidism, unspecified: Secondary | ICD-10-CM | POA: Diagnosis not present

## 2021-09-09 DIAGNOSIS — Q909 Down syndrome, unspecified: Secondary | ICD-10-CM | POA: Diagnosis not present

## 2021-09-09 HISTORY — DX: Cardiac murmur, unspecified: R01.1

## 2021-09-09 HISTORY — DX: Hypothyroidism, unspecified: E03.9

## 2021-09-12 NOTE — Progress Notes (Signed)
Anesthesia Chart Review   Case: [id number] Date/Time: 09/19/21 0700   Procedures:      LAPAROSCOPIC GASTRIC SLEEVE RESECTION     UPPER GI ENDOSCOPY   Anesthesia type: General   Pre-op diagnosis: HYPOTHYROIDISM, MORBID OBESITY, PSEUDOCHLINESTERASE DISEASE   Location: [facility] ROOM 02 / [facility]   Surgeons: Berna Bue, MD       DISCUSSION:36 y.o. never smoker with h/o PONV, Down's syndrome, PE 2018, hypothyroidism, morbid obesity scheduled for above procedure 09/19/2021 with Dr. Phylliss Blakes.   Pt with h/o pseudocholinesterase deficiency.    VS: BP (!) 151/77    Pulse (!) 56    Temp 36.5 C (Oral)    Resp 16    Ht 4\' 11"  (1.499 m)    Wt 125.2 kg    SpO2 97%    BMI 55.75 kg/m   PROVIDERS: , MD is PCP    LABS:  labs DOS (all labs ordered are listed, but only abnormal results are displayed)  Labs Reviewed - No data to display   IMAGES:   EKG: 10/14/2020 Rate 90 bpm  NSR  CV:  Past Medical History:  Diagnosis Date   CAP (community acquired pneumonia) 11/01/2016   Chronic bronchitis (HCC)    "gets it most years" (11/01/2016)   Conversion disorder dx'd ~ 2008   Down's syndrome    Family history of adverse reaction to anesthesia    "mom has Pseudocholinesterase deficiency"   Heart murmur    Hypothyroidism    Pneumomediastinum (HCC) 11/01/2016   Pneumonia    "almost yearly til age 57; maybe once since" (11/01/2016)   PONV (postoperative nausea and vomiting)    Pseudocholinesterase deficiency   Pseudocholinesterase deficiency    Pulmonary embolism (HCC) 01/2016   "both lungs" (11/01/2016)    Past Surgical History:  Procedure Laterality Date   BUNIONECTOMY Bilateral    EYE MUSCLE SURGERY Bilateral    MYRINGOPLASTY Bilateral    "she's had many sets" (11/01/2016)   TOE FUSION Right    "big toe"   TONSILLECTOMY AND ADENOIDECTOMY      MEDICATIONS:  acetaminophen (TYLENOL) 325 MG tablet   Azelastine HCl 137 MCG/SPRAY SOLN   calcium carbonate (TUMS -  DOSED IN MG ELEMENTAL CALCIUM) 500 MG chewable tablet   levocetirizine (XYZAL) 5 MG tablet   levothyroxine (SYNTHROID) 100 MCG tablet   montelukast (SINGULAIR) 10 MG tablet   Multiple Vitamins-Minerals (MULTIVITAMIN WITH MINERALS) tablet   Vitamin D, Ergocalciferol, (DRISDOL) 1.25 MG (50000 UNIT) CAPS capsule   No current facility-administered medications for this encounter.     01/01/2017 Ward, PA-C [facility]-Surgical Testing [phone]

## 2021-09-15 ENCOUNTER — Other Ambulatory Visit: Payer: Self-pay

## 2021-09-15 ENCOUNTER — Encounter (HOSPITAL_COMMUNITY)
Admission: RE | Admit: 2021-09-15 | Discharge: 2021-09-15 | Disposition: A | Discharge status: Home / Self Care | Payer: Medicare Other | Source: Ambulatory Visit | Attending: Surgery | Admitting: Surgery

## 2021-09-15 DIAGNOSIS — Z01812 Encounter for preprocedural laboratory examination: Secondary | ICD-10-CM | POA: Diagnosis present

## 2021-09-15 DIAGNOSIS — Z20822 Contact with and (suspected) exposure to covid-19: Secondary | ICD-10-CM | POA: Diagnosis not present

## 2021-09-15 DIAGNOSIS — Z01818 Encounter for other preprocedural examination: Secondary | ICD-10-CM

## 2021-09-15 LAB — SARS CORONAVIRUS 2 (TAT 6-24 HRS): SARS Coronavirus 2: NEGATIVE

## 2021-09-19 ENCOUNTER — Inpatient Hospital Stay (HOSPITAL_COMMUNITY): Payer: Medicare Other | Admitting: Anesthesiology

## 2021-09-19 ENCOUNTER — Other Ambulatory Visit: Payer: Self-pay

## 2021-09-19 ENCOUNTER — Encounter (HOSPITAL_COMMUNITY)
Admission: RE | Disposition: A | Discharge status: Home / Self Care | Payer: Self-pay | Source: Home / Self Care | Attending: Surgery

## 2021-09-19 ENCOUNTER — Inpatient Hospital Stay (HOSPITAL_COMMUNITY): Payer: Medicare Other | Admitting: Physician Assistant

## 2021-09-19 ENCOUNTER — Inpatient Hospital Stay (HOSPITAL_COMMUNITY)
Admission: RE | Admit: 2021-09-19 | Discharge: 2021-09-20 | DRG: 621 | Disposition: A | Discharge status: Home / Self Care | Payer: Medicare Other | Attending: Surgery | Admitting: Surgery

## 2021-09-19 ENCOUNTER — Encounter (HOSPITAL_COMMUNITY): Payer: Self-pay | Admitting: Surgery

## 2021-09-19 DIAGNOSIS — Z6841 Body Mass Index (BMI) 40.0 and over, adult: Secondary | ICD-10-CM

## 2021-09-19 DIAGNOSIS — Q909 Down syndrome, unspecified: Secondary | ICD-10-CM | POA: Diagnosis not present

## 2021-09-19 DIAGNOSIS — E8809 Other disorders of plasma-protein metabolism, not elsewhere classified: Secondary | ICD-10-CM | POA: Diagnosis present

## 2021-09-19 DIAGNOSIS — Z86711 Personal history of pulmonary embolism: Secondary | ICD-10-CM | POA: Diagnosis not present

## 2021-09-19 DIAGNOSIS — E889 Metabolic disorder, unspecified: Secondary | ICD-10-CM | POA: Diagnosis not present

## 2021-09-19 DIAGNOSIS — Z79899 Other long term (current) drug therapy: Secondary | ICD-10-CM | POA: Diagnosis not present

## 2021-09-19 DIAGNOSIS — Z882 Allergy status to sulfonamides status: Secondary | ICD-10-CM

## 2021-09-19 DIAGNOSIS — Z79890 Hormone replacement therapy (postmenopausal): Secondary | ICD-10-CM | POA: Diagnosis not present

## 2021-09-19 DIAGNOSIS — E039 Hypothyroidism, unspecified: Secondary | ICD-10-CM | POA: Diagnosis present

## 2021-09-19 DIAGNOSIS — Z888 Allergy status to other drugs, medicaments and biological substances status: Secondary | ICD-10-CM | POA: Diagnosis not present

## 2021-09-19 DIAGNOSIS — Z20822 Contact with and (suspected) exposure to covid-19: Secondary | ICD-10-CM | POA: Diagnosis present

## 2021-09-19 DIAGNOSIS — Z01818 Encounter for other preprocedural examination: Secondary | ICD-10-CM

## 2021-09-19 HISTORY — PX: LAPAROSCOPIC GASTRIC SLEEVE RESECTION: SHX5895

## 2021-09-19 HISTORY — PX: UPPER GI ENDOSCOPY: SHX6162

## 2021-09-19 LAB — ABO/RH: ABO/RH(D): A POS

## 2021-09-19 LAB — TYPE AND SCREEN
ABO/RH(D): A POS
Antibody Screen: NEGATIVE

## 2021-09-19 LAB — HCG, SERUM, QUALITATIVE: Preg, Serum: NEGATIVE

## 2021-09-19 SURGERY — GASTRECTOMY, SLEEVE, LAPAROSCOPIC
Anesthesia: General

## 2021-09-19 MED ORDER — ENSURE MAX PROTEIN PO LIQD
2.0000 [oz_av] | ORAL | Status: DC
  Administered 2021-09-20 (×3): 2 [oz_av] via ORAL

## 2021-09-19 MED ORDER — OXYCODONE HCL 5 MG PO TABS: 5.0000 mg | ORAL_TABLET | Freq: Once | ORAL | Status: DC | PRN

## 2021-09-19 MED ORDER — ROCURONIUM BROMIDE 10 MG/ML (PF) SYRINGE
PREFILLED_SYRINGE | INTRAVENOUS | Status: DC | PRN
  Administered 2021-09-19: 100 mg via INTRAVENOUS

## 2021-09-19 MED ORDER — PROPOFOL 10 MG/ML IV BOLUS
INTRAVENOUS | Status: DC | PRN
  Administered 2021-09-19: 200 mg via INTRAVENOUS

## 2021-09-19 MED ORDER — SUGAMMADEX SODIUM 500 MG/5ML IV SOLN
INTRAVENOUS | Status: DC | PRN
Start: 2021-09-19 — End: 2021-09-19
  Administered 2021-09-19: 486.8 mg via INTRAVENOUS

## 2021-09-19 MED ORDER — LACTATED RINGERS IR SOLN
Status: DC | PRN
Start: 2021-09-19 — End: 2021-09-19
  Administered 2021-09-19: 1000 mL

## 2021-09-19 MED ORDER — ACETAMINOPHEN 500 MG PO TABS: 1000.0000 mg | ORAL_TABLET | Freq: Three times a day (TID) | ORAL | Status: DC

## 2021-09-19 MED ORDER — ONDANSETRON HCL 4 MG/2ML IJ SOLN
INTRAMUSCULAR | Status: AC
  Filled 2021-09-19: qty 2

## 2021-09-19 MED ORDER — BUPIVACAINE LIPOSOME 1.3 % IJ SUSP
INTRAMUSCULAR | Status: DC | PRN
  Administered 2021-09-19: 20 mL

## 2021-09-19 MED ORDER — OXYCODONE HCL 5 MG/5ML PO SOLN: 5.0000 mg | Freq: Once | ORAL | Status: DC | PRN

## 2021-09-19 MED ORDER — LIDOCAINE HCL 2 % IJ SOLN
INTRAMUSCULAR | Status: AC
  Filled 2021-09-19: qty 20

## 2021-09-19 MED ORDER — EPHEDRINE SULFATE (PRESSORS) 50 MG/ML IJ SOLN
INTRAMUSCULAR | Status: DC | PRN
  Administered 2021-09-19 (×2): 5 mg via INTRAVENOUS

## 2021-09-19 MED ORDER — DEXAMETHASONE SODIUM PHOSPHATE 10 MG/ML IJ SOLN
INTRAMUSCULAR | Status: AC
  Filled 2021-09-19: qty 1

## 2021-09-19 MED ORDER — ROCURONIUM BROMIDE 10 MG/ML (PF) SYRINGE
PREFILLED_SYRINGE | INTRAVENOUS | Status: AC
  Filled 2021-09-19: qty 10

## 2021-09-19 MED ORDER — ENOXAPARIN SODIUM 30 MG/0.3ML IJ SOSY
30.0000 mg | PREFILLED_SYRINGE | Freq: Two times a day (BID) | INTRAMUSCULAR | Status: DC
  Administered 2021-09-19 – 2021-09-20 (×2): 30 mg via SUBCUTANEOUS
  Filled 2021-09-19 (×2): qty 0.3

## 2021-09-19 MED ORDER — APREPITANT 40 MG PO CAPS
40.0000 mg | ORAL_CAPSULE | ORAL | Status: AC
  Administered 2021-09-19: 40 mg via ORAL
  Filled 2021-09-19: qty 1

## 2021-09-19 MED ORDER — ACETAMINOPHEN 500 MG PO TABS
1000.0000 mg | ORAL_TABLET | ORAL | Status: AC
  Administered 2021-09-19: 1000 mg via ORAL
  Filled 2021-09-19: qty 2

## 2021-09-19 MED ORDER — PANTOPRAZOLE SODIUM 40 MG IV SOLR
40.0000 mg | Freq: Every day | INTRAVENOUS | Status: DC
  Administered 2021-09-19: 40 mg via INTRAVENOUS
  Filled 2021-09-19: qty 10

## 2021-09-19 MED ORDER — FENTANYL CITRATE (PF) 250 MCG/5ML IJ SOLN
INTRAMUSCULAR | Status: AC
  Filled 2021-09-19: qty 5

## 2021-09-19 MED ORDER — SUGAMMADEX SODIUM 500 MG/5ML IV SOLN
INTRAVENOUS | Status: AC
  Filled 2021-09-19: qty 5

## 2021-09-19 MED ORDER — MIDAZOLAM HCL 5 MG/5ML IJ SOLN
INTRAMUSCULAR | Status: DC | PRN
Start: 2021-09-19 — End: 2021-09-19
  Administered 2021-09-19 (×2): 1 mg via INTRAVENOUS

## 2021-09-19 MED ORDER — KETAMINE HCL 10 MG/ML IJ SOLN
INTRAMUSCULAR | Status: DC | PRN
  Administered 2021-09-19: 20 mg via INTRAVENOUS

## 2021-09-19 MED ORDER — EPHEDRINE 5 MG/ML INJ
INTRAVENOUS | Status: AC
  Filled 2021-09-19: qty 5

## 2021-09-19 MED ORDER — PROPOFOL 10 MG/ML IV BOLUS
INTRAVENOUS | Status: AC
Start: 2021-09-19 — End: ?
  Filled 2021-09-19: qty 20

## 2021-09-19 MED ORDER — HYDROMORPHONE HCL 1 MG/ML IJ SOLN
INTRAMUSCULAR | Status: AC
  Filled 2021-09-19: qty 2

## 2021-09-19 MED ORDER — GENTAMICIN SULFATE 40 MG/ML IJ SOLN: 1.5000 mg/kg | INTRAVENOUS | Status: DC

## 2021-09-19 MED ORDER — FENTANYL CITRATE (PF) 100 MCG/2ML IJ SOLN
INTRAMUSCULAR | Status: DC | PRN
  Administered 2021-09-19: 100 ug via INTRAVENOUS

## 2021-09-19 MED ORDER — STERILE WATER FOR IRRIGATION IR SOLN
Status: DC | PRN
  Administered 2021-09-19: 500 mL
  Administered 2021-09-19: 1000 mL

## 2021-09-19 MED ORDER — ACETAMINOPHEN 160 MG/5ML PO SOLN
1000.0000 mg | Freq: Three times a day (TID) | ORAL | Status: DC
  Administered 2021-09-19: 1000 mg via ORAL
  Filled 2021-09-19: qty 40.6

## 2021-09-19 MED ORDER — DEXAMETHASONE SODIUM PHOSPHATE 4 MG/ML IJ SOLN
INTRAMUSCULAR | Status: DC | PRN
  Administered 2021-09-19: 10 mg via INTRAVENOUS

## 2021-09-19 MED ORDER — CHLORHEXIDINE GLUCONATE 0.12 % MT SOLN
15.0000 mL | Freq: Once | OROMUCOSAL | Status: AC
  Administered 2021-09-19: 15 mL via OROMUCOSAL

## 2021-09-19 MED ORDER — TRAMADOL HCL 50 MG PO TABS: 50.0000 mg | ORAL_TABLET | Freq: Four times a day (QID) | ORAL | Status: DC | PRN

## 2021-09-19 MED ORDER — MIDAZOLAM HCL 2 MG/2ML IJ SOLN
INTRAMUSCULAR | Status: AC
  Filled 2021-09-19: qty 2

## 2021-09-19 MED ORDER — SODIUM CHLORIDE 0.9 % IV SOLN: INTRAVENOUS | Status: DC

## 2021-09-19 MED ORDER — 0.9 % SODIUM CHLORIDE (POUR BTL) OPTIME
TOPICAL | Status: DC | PRN
  Administered 2021-09-19: 1000 mL

## 2021-09-19 MED ORDER — HYDROMORPHONE HCL 1 MG/ML IJ SOLN
0.2500 mg | INTRAMUSCULAR | Status: DC | PRN
  Administered 2021-09-19: 0.5 mg via INTRAVENOUS

## 2021-09-19 MED ORDER — PROPOFOL 10 MG/ML IV BOLUS
INTRAVENOUS | Status: AC
  Filled 2021-09-19: qty 20

## 2021-09-19 MED ORDER — SIMETHICONE 80 MG PO CHEW
80.0000 mg | CHEWABLE_TABLET | Freq: Four times a day (QID) | ORAL | Status: DC | PRN
  Administered 2021-09-19: 80 mg via ORAL

## 2021-09-19 MED ORDER — DOCUSATE SODIUM 100 MG PO CAPS
100.0000 mg | ORAL_CAPSULE | Freq: Two times a day (BID) | ORAL | Status: DC
  Administered 2021-09-19 – 2021-09-20 (×3): 100 mg via ORAL
  Filled 2021-09-19 (×3): qty 1

## 2021-09-19 MED ORDER — PHENYLEPHRINE 40 MCG/ML (10ML) SYRINGE FOR IV PUSH (FOR BLOOD PRESSURE SUPPORT)
PREFILLED_SYRINGE | INTRAVENOUS | Status: DC | PRN
  Administered 2021-09-19: 40 ug via INTRAVENOUS

## 2021-09-19 MED ORDER — CHLORHEXIDINE GLUCONATE 4 % EX LIQD: 60.0000 mL | Freq: Once | CUTANEOUS | Status: DC

## 2021-09-19 MED ORDER — HYDRALAZINE HCL 20 MG/ML IJ SOLN: 10.0000 mg | INTRAMUSCULAR | Status: DC | PRN

## 2021-09-19 MED ORDER — PHENYLEPHRINE 40 MCG/ML (10ML) SYRINGE FOR IV PUSH (FOR BLOOD PRESSURE SUPPORT)
PREFILLED_SYRINGE | INTRAVENOUS | Status: AC
  Filled 2021-09-19: qty 10

## 2021-09-19 MED ORDER — OXYCODONE HCL 5 MG/5ML PO SOLN
5.0000 mg | Freq: Four times a day (QID) | ORAL | Status: DC | PRN
  Filled 2021-09-19: qty 5

## 2021-09-19 MED ORDER — BUPIVACAINE LIPOSOME 1.3 % IJ SUSP
INTRAMUSCULAR | Status: AC
  Filled 2021-09-19: qty 20

## 2021-09-19 MED ORDER — ACETAMINOPHEN 500 MG PO TABS: 1000.0000 mg | ORAL_TABLET | Freq: Once | ORAL | Status: DC

## 2021-09-19 MED ORDER — LEVOTHYROXINE SODIUM 100 MCG PO TABS
100.0000 ug | ORAL_TABLET | Freq: Every day | ORAL | Status: DC
  Administered 2021-09-19: 100 ug via ORAL
  Filled 2021-09-19: qty 1

## 2021-09-19 MED ORDER — LIDOCAINE HCL (PF) 2 % IJ SOLN
INTRAMUSCULAR | Status: DC | PRN
  Administered 2021-09-19: 1.5 mg/kg/h via INTRADERMAL

## 2021-09-19 MED ORDER — ONDANSETRON HCL 4 MG/2ML IJ SOLN
4.0000 mg | INTRAMUSCULAR | Status: DC | PRN
Start: 2021-09-19 — End: 2021-09-20

## 2021-09-19 MED ORDER — HYDROMORPHONE HCL 1 MG/ML IJ SOLN: 0.5000 mg | INTRAMUSCULAR | Status: DC | PRN

## 2021-09-19 MED ORDER — ONDANSETRON HCL 4 MG/2ML IJ SOLN: 4.0000 mg | Freq: Once | INTRAMUSCULAR | Status: DC | PRN

## 2021-09-19 MED ORDER — GENTAMICIN SULFATE 40 MG/ML IJ SOLN
390.0000 mg | INTRAVENOUS | Status: AC
  Administered 2021-09-19: 390 mg via INTRAVENOUS
  Filled 2021-09-19: qty 9.75

## 2021-09-19 MED ORDER — BUPIVACAINE-EPINEPHRINE (PF) 0.25% -1:200000 IJ SOLN
INTRAMUSCULAR | Status: AC
  Filled 2021-09-19: qty 30

## 2021-09-19 MED ORDER — HEPARIN SODIUM (PORCINE) 5000 UNIT/ML IJ SOLN
5000.0000 [IU] | INTRAMUSCULAR | Status: AC
  Administered 2021-09-19: 5000 [IU] via SUBCUTANEOUS
  Filled 2021-09-19: qty 1

## 2021-09-19 MED ORDER — LIDOCAINE 2% (20 MG/ML) 5 ML SYRINGE
INTRAMUSCULAR | Status: DC | PRN
  Administered 2021-09-19: 100 mg via INTRAVENOUS

## 2021-09-19 MED ORDER — METOPROLOL TARTRATE 5 MG/5ML IV SOLN: 5.0000 mg | Freq: Four times a day (QID) | INTRAVENOUS | Status: DC | PRN

## 2021-09-19 MED ORDER — KETAMINE HCL 50 MG/5ML IJ SOSY
PREFILLED_SYRINGE | INTRAMUSCULAR | Status: AC
  Filled 2021-09-19: qty 5

## 2021-09-19 MED ORDER — LACTATED RINGERS IV SOLN: INTRAVENOUS | Status: DC

## 2021-09-19 MED ORDER — CLINDAMYCIN PHOSPHATE 900 MG/50ML IV SOLN
900.0000 mg | INTRAVENOUS | Status: AC
  Administered 2021-09-19: 900 mg via INTRAVENOUS
  Filled 2021-09-19: qty 50

## 2021-09-19 MED ORDER — BUPIVACAINE-EPINEPHRINE 0.25% -1:200000 IJ SOLN
INTRAMUSCULAR | Status: DC | PRN
  Administered 2021-09-19: 30 mL

## 2021-09-19 MED ORDER — BUPIVACAINE LIPOSOME 1.3 % IJ SUSP: 20.0000 mL | Freq: Once | INTRAMUSCULAR | Status: DC

## 2021-09-19 SURGICAL SUPPLY — 73 items
APPLIER CLIP ROT 10 11.4 M/L (STAPLE)
APPLIER CLIP ROT 13.4 12 LRG (CLIP)
BAG COUNTER SPONGE SURGICOUNT (BAG) IMPLANT
BAG LAPAROSCOPIC 12 15 PORT 16 (BASKET) IMPLANT
BAG RETRIEVAL 12/15 (BASKET)
BENZOIN TINCTURE PRP APPL 2/3 (GAUZE/BANDAGES/DRESSINGS) ×2 IMPLANT
BLADE SURG SZ11 CARB STEEL (BLADE) ×2 IMPLANT
BNDG ADH 1X3 SHEER STRL LF (GAUZE/BANDAGES/DRESSINGS) ×12 IMPLANT
CABLE HIGH FREQUENCY MONO STRZ (ELECTRODE) ×1 IMPLANT
CHLORAPREP W/TINT 26 (MISCELLANEOUS) ×4 IMPLANT
CLIP APPLIE ROT 10 11.4 M/L (STAPLE) IMPLANT
CLIP APPLIE ROT 13.4 12 LRG (CLIP) IMPLANT
CLSR STERI-STRIP ANTIMIC 1/2X4 (GAUZE/BANDAGES/DRESSINGS) ×1 IMPLANT
COVER SURGICAL LIGHT HANDLE (MISCELLANEOUS) ×2 IMPLANT
DEVICE SUT QUICK LOAD TK 5 (STAPLE) IMPLANT
DEVICE SUT TI-KNOT TK 5X26 (MISCELLANEOUS) IMPLANT
DRAPE UTILITY XL STRL (DRAPES) ×4 IMPLANT
ELECT REM PT RETURN 15FT ADLT (MISCELLANEOUS) ×2 IMPLANT
GAUZE SPONGE 4X4 12PLY STRL (GAUZE/BANDAGES/DRESSINGS) IMPLANT
GLOVE SURG ENC MOIS LTX SZ6 (GLOVE) ×2 IMPLANT
GLOVE SURG MICRO LTX SZ6 (GLOVE) ×2 IMPLANT
GLOVE SURG UNDER LTX SZ6.5 (GLOVE) ×2 IMPLANT
GOWN STRL REUS W/TWL LRG LVL3 (GOWN DISPOSABLE) ×2 IMPLANT
GOWN STRL REUS W/TWL XL LVL3 (GOWN DISPOSABLE) ×4 IMPLANT
GRASPER SUT TROCAR 14GX15 (MISCELLANEOUS) ×2 IMPLANT
IRRIG SUCT STRYKERFLOW 2 WTIP (MISCELLANEOUS) ×2
IRRIGATION SUCT STRKRFLW 2 WTP (MISCELLANEOUS) ×1 IMPLANT
KIT BASIN OR (CUSTOM PROCEDURE TRAY) ×2 IMPLANT
KIT GASTRIC LAVAGE 34FR ADT (SET/KITS/TRAYS/PACK) ×1 IMPLANT
KIT TURNOVER KIT A (KITS) ×1 IMPLANT
MARKER SKIN DUAL TIP RULER LAB (MISCELLANEOUS) ×2 IMPLANT
MAT PREVALON FULL STRYKER (MISCELLANEOUS) ×2 IMPLANT
NDL SPNL 22GX3.5 QUINCKE BK (NEEDLE) ×1 IMPLANT
NEEDLE SPNL 22GX3.5 QUINCKE BK (NEEDLE) ×2 IMPLANT
PACK UNIVERSAL I (CUSTOM PROCEDURE TRAY) ×2 IMPLANT
RELOAD ENDO STITCH (ENDOMECHANICALS) IMPLANT
RELOAD STAPLE 60 3.6 BLU REG (STAPLE) ×1 IMPLANT
RELOAD STAPLE 60 3.8 GOLD REG (STAPLE) ×1 IMPLANT
RELOAD STAPLE 60 4.1 GRN THCK (STAPLE) IMPLANT
RELOAD STAPLER BLUE 60MM (STAPLE) ×5 IMPLANT
RELOAD STAPLER GOLD 60MM (STAPLE) ×1 IMPLANT
RELOAD STAPLER GREEN 60MM (STAPLE) ×1 IMPLANT
RELOAD SUT TRIPLE-STITCH 2-0 (ENDOMECHANICALS) IMPLANT
SCISSORS LAP 5X45 EPIX DISP (ENDOMECHANICALS) ×2 IMPLANT
SET TUBE SMOKE EVAC HIGH FLOW (TUBING) ×2 IMPLANT
SHEARS HARMONIC ACE PLUS 45CM (MISCELLANEOUS) ×2 IMPLANT
SLEEVE ADV FIXATION 5X100MM (TROCAR) ×4 IMPLANT
SLEEVE GASTRECTOMY 36FR VISIGI (MISCELLANEOUS) ×1 IMPLANT
SLEEVE GASTRECTOMY 40FR VISIGI (MISCELLANEOUS) ×2 IMPLANT
SOL ANTI FOG 6CC (MISCELLANEOUS) ×1 IMPLANT
SOLUTION ANTI FOG 6CC (MISCELLANEOUS) ×1
SPIKE FLUID TRANSFER (MISCELLANEOUS) ×2 IMPLANT
SPONGE T-LAP 18X18 ~~LOC~~+RFID (SPONGE) ×2 IMPLANT
STAPLE LINE REINFORCEMENT LAP (STAPLE) ×6 IMPLANT
STAPLER ECHELON LONG 3000 60 (ENDOMECHANICALS) ×1 IMPLANT
STAPLER ECHELON LONG 60 440 (INSTRUMENTS) ×1 IMPLANT
STAPLER RELOAD BLUE 60MM (STAPLE) ×10
STAPLER RELOAD GOLD 60MM (STAPLE) ×2
STAPLER RELOAD GREEN 60MM (STAPLE) ×2
STRIP CLOSURE SKIN 1/2X4 (GAUZE/BANDAGES/DRESSINGS) ×2 IMPLANT
SUT MNCRL AB 4-0 PS2 18 (SUTURE) ×2 IMPLANT
SUT SURGIDAC NAB ES-9 0 48 120 (SUTURE) IMPLANT
SUT VICRYL 0 TIES 12 18 (SUTURE) ×2 IMPLANT
SYR 10ML ECCENTRIC (SYRINGE) ×2 IMPLANT
SYR 20ML LL LF (SYRINGE) ×2 IMPLANT
SYR 50ML LL SCALE MARK (SYRINGE) ×2 IMPLANT
TOWEL OR 17X26 10 PK STRL BLUE (TOWEL DISPOSABLE) ×2 IMPLANT
TOWEL OR NON WOVEN STRL DISP B (DISPOSABLE) ×2 IMPLANT
TROCAR ADV FIXATION 5X100MM (TROCAR) ×2 IMPLANT
TROCAR BLADELESS 15MM (ENDOMECHANICALS) ×2 IMPLANT
TROCAR BLADELESS OPT 5 100 (ENDOMECHANICALS) ×2 IMPLANT
TUBING CONNECTING 10 (TUBING) ×2 IMPLANT
TUBING ENDO SMARTCAP (MISCELLANEOUS) ×2 IMPLANT

## 2021-09-19 NOTE — Progress Notes (Signed)
°  Transition of Care (TOC) Screening Note   Patient Details  Name: Angela Odom Date of Birth: 01/07/1986   Transition of Care [facility]) CM/SW Contact:    Lennart Pall, LCSW Phone Number: 09/19/2021, 2:43 PM    Transition of Care Department [facility]) has reviewed patient and no TOC needs have been identified at this time. We will continue to monitor patient advancement through interdisciplinary progression rounds. If new patient transition needs arise, please place a TOC consult.

## 2021-09-19 NOTE — Op Note (Signed)
Operative Note  Angela Odom  [id number]  [id number]  09/19/2021   Surgeon: Berna Bue MD   Assistant: Feliciana Rossetti MD   Procedure performed: laparoscopic sleeve gastrectomy, upper endoscopy   Preop diagnosis: Morbid obesity Body mass index is 54.18 kg/m. Post-op diagnosis/intraop findings: same   Specimens: fundus Retained items: none  EBL: minimal  Complications: none   Description of procedure: After obtaining informed consent and administration of chemical DVT prophylaxis in holding, the patient was taken to the operating room and placed supine on operating room table where general endotracheal anesthesia was initiated, preoperative antibiotics were administered, SCDs applied, and a formal timeout was performed. The abdomen was prepped and draped in usual sterile fashion. Peritoneal access was gained using a Visiport technique in the left upper quadrant and insufflation to 15 mmHg ensued without issue. Gross inspection revealed no evidence of injury. Under direct visualization three more 5 mm trochars were placed in the right and left hemiabdomen and the 37mm trocar in the right paramedian upper abdomen. Bilateral laparoscopic assisted TAPS blocks were performed with Exparel diluted with 0.25 percent Marcaine with epinephrine. The patient was placed in steep Trendelenburg and the liver retractor was introduced through an incision in the upper midline and secured to the post externally to maintain the left lobe retracted anteriorly.  There is no hiatal hernia on direct inspection. Using the Harmonic scalpel, the greater curvature of the stomach was dissected away from the greater omentum and short gastric vessels were divided. This began 6 cm from the pylorus, and dissection proceeded until the left crus was clearly exposed. There were some filmy adhesions of the posterior stomach to the pancreas which were divided with the Harmonic. The 42 French VisiGi was then introduced and  directed down towards the pylorus. This was placed to suction against the lesser curve. Serial fires of the linear cutting stapler with seamguards were then employed to create our sleeve. The first fire used a gold load and ensured adequate room at the angularis incisura. Several blue loads were then employed to create a narrow tubular stomach up to the angle of His. The excised stomach was then removed through our 15 mm trocar site within an Endo Catch bag.  The visigi was taken off of suction and a few puffs of air were introduced, inflating the sleeve. No bubbles were observed in the irrigation fluid around the stomach and the shape was noted to be evenly tubular without any narrowing at the angularis. The visigi was then removed. Upper endoscopy was performed by the assistant surgeon and the sleeve was noted to be airtight, the staple line was hemostatic. Please see his separate note. The endoscope was removed.The abdomen was inspected again and hemostasis was confirmed. The 15 mm trocar site fascia in the right upper abdomen was closed with a 0 Vicryl using the laparoscopic suture passer under direct visualization. The liver retractor was removed under direct visualization. The abdomen was then desufflated and all remaining trochars removed. The skin incisions were closed with subcuticular Monocryl; benzoin, Steri-Strips and Band-Aids were applied The patient was then awakened, extubated and taken to PACU in stable condition.     All counts were correct at the completion of the case.

## 2021-09-19 NOTE — Progress Notes (Signed)
Patient ambulated to the room from the stretcher. Patient was too sleepy to start IS but later demonstrated competency reaching 1500. Pt complain of 10/10 pain and medication was given to bring that number down. Will continue to monitor.

## 2021-09-19 NOTE — Progress Notes (Signed)
PHARMACY CONSULT FOR:  Risk Assessment for Post-Discharge VTE Following Bariatric Surgery  Post-Discharge VTE Risk Assessment: This patient's probability of 30-day post-discharge VTE is increased due to the factors marked: X Sleeve gastrectomy   Liver disorder (transplant, cirrhosis, or nonalcoholic steatohepatitis)  X Hx of VTE   Hemorrhage requiring transfusion   GI perforation, leak, or obstruction   ====================================================   Female   Age >/=60 years  X BMI >/=50 kg/m2    CHF    Dyspnea at Rest    Paraplegia  X  Non-gastric-band surgery    Operation Time >/=3 hr    Return to OR     Length of Stay >/= 3 d   Hypercoagulable condition   Significant venous stasis      Predicted probability of 30-day post-discharge VTE: N/A- pt with 2 risk factors for portomesenteric vein thrombosis  Other patient-specific factors to consider: N/A   Recommendation for Discharge: Enoxaparin 60 mg  q12h x 30 days post-discharge  Angela Odom is a 36 y.o. female who underwent laparoscopic sleeve gastrectomy 09/19/2021    Allergies  Allergen Reactions   Succinylcholine     Patient has pseudocholinesterase deficiency- took 5 hours to metabolize succ after eye surgery.  (This was 1991)   Ceclor [Cefaclor] Hives   Sulfa Antibiotics Hives    Patient Measurements: Height: 4\' 11"  (149.9 cm) Weight: 121.7 kg (268 lb 4 oz) IBW/kg (Calculated) : 43.2 Body mass index is 54.18 kg/m.  No results for input(s): WBC, HGB, HCT, PLT, APTT, CREATININE, LABCREA, CREATININE, CREAT24HRUR, MG, PHOS, ALBUMIN, PROT, ALBUMIN, AST, ALT, ALKPHOS, BILITOT, BILIDIR, IBILI in the last 72 hours. CrCl cannot be calculated (Patient's most recent lab result is older than the maximum 21 days allowed.).    Past Medical History:  Diagnosis Date   CAP (community acquired pneumonia) 11/01/2016   Chronic bronchitis (HCC)    "gets it most years" (11/01/2016)   Conversion disorder dx'd ~  2008   Down's syndrome    Family history of adverse reaction to anesthesia    "mom has Pseudocholinesterase deficiency"   Heart murmur    Hypothyroidism    Pneumomediastinum (HCC) 11/01/2016   Pneumonia    "almost yearly til age 17; maybe once since" (11/01/2016)   PONV (postoperative nausea and vomiting)    Pseudocholinesterase deficiency   Pseudocholinesterase deficiency    Pulmonary embolism (HCC) 01/2016   "both lungs" (11/01/2016)     Medications Prior to Admission  Medication Sig Dispense Refill Last Dose   Azelastine HCl 137 MCG/SPRAY SOLN Place 1-2 sprays into the nose in the morning and at bedtime.   09/18/2021   calcium carbonate (TUMS - DOSED IN MG ELEMENTAL CALCIUM) 500 MG chewable tablet Chew 1 tablet by mouth 2 (two) times daily.   09/18/2021   levocetirizine (XYZAL) 5 MG tablet Take 5 mg by mouth every evening.   09/18/2021   levothyroxine (SYNTHROID) 100 MCG tablet Take 100 mcg by mouth at bedtime.   09/18/2021   montelukast (SINGULAIR) 10 MG tablet Take 10 mg by mouth every evening.   09/18/2021   Multiple Vitamins-Minerals (MULTIVITAMIN WITH MINERALS) tablet Take 2 tablets by mouth daily.   09/18/2021   Vitamin D, Ergocalciferol, (DRISDOL) 1.25 MG (50000 UNIT) CAPS capsule Take 50,000 Units by mouth every Wednesday.   Past Week   acetaminophen (TYLENOL) 325 MG tablet Take 2 tablets (650 mg total) by mouth every 6 (six) hours as needed for mild pain (or Fever >/= 101). 30 tablet 0 More  than a month       Herby Abraham, Pharm.D 09/19/2021 1:41 PM

## 2021-09-19 NOTE — H&P (Signed)
°Angela Odom °[id number]  °  °Referring Provider:  Self °  °  °Subjective  °  °Chief Complaint: pre-op bariatric surgery °  °  °  °History of Present Illness: °Returns for follow-up regarding surgical treatment of morbid obesity.  She has completed the bariatric pathway with no barriers identified and is tentatively scheduled for surgery at the end of February.  She has not had any changes in her health since our initial meeting 10 months ago.  She and her family have a few questions to discuss today. °CXR/UGI: neg/ no hh °Dietician approved °Psychology approved °  °09/28/20: 36-year-old woman with a history of pulmonary emboli in 2017 (was on OCPs and this occurred following a trans[place] flight), pseudocholinesterase deficiency, postop nausea and vomiting, Down's syndrome, conversion disorder, chronic bronchitis and previous episodes of pneumonia, no prior abdominal surgeries who presents for consultation regarding surgical management of morbid obesity.  She is referred by [facility].  She has been struggling with this for essentially her entire life.  She has tried numerous diets, exercise plans and medications.  Most recently she started on phentermine.  This has stabilized her weight but she is not losing with this.  Does report that it makes her jittery.  Working with a nutritionist and reports that she has learned quite a lot and her mother states that she is incredibly compliant with the recommended eating style but just does not lose weight.  This is affecting her mobility, and her ability to work.  She completed the beyond academics program at [employer], lives here in town with her roommate, and her family lives in [place] and are very involved and supportive.  She has not been able to work because she is hardly able to do anything due to her weight, she is not able to travel with her family that she cannot keep up, and she does recognize that her weight is a problem.  Her  goal is to improve her health and to avoid developing complications of obesity. °Denies any history of GERD/reflux, or problems with constipation/bowel movements. 266.5lb/ BMI 56 °  °Review of Systems: °A complete review of systems was obtained from the patient.  I have reviewed this information and discussed as appropriate with the patient.  See HPI as well for other ROS. °  °  °Medical History: °Past Medical History  °    °Past Medical History:  °Diagnosis Date  ° Pulmonary embolus (CMS-HCC)    ° Thyroid disease    °  °  °  °There is no problem list on file for this patient. 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  (This was 1991)  ° Cefaclor Hives  ° Other Hives  °  °  °  °      °Current Outpatient Medications on File Prior to Visit  °Medication Sig Dispense Refill  ° acetaminophen-codeine (TYLENOL #3) 300-30 mg tablet acetaminophen 300 mg-codeine 30 mg tablet ° TAKE 1 TABLET BY MOUTH EVERY 6 HOURS FOR 3 DAYS      °   azelastine (ASTELIN) 137 mcg nasal spray azelastine 137 mcg (0.1 %) nasal spray aerosol      ° ergocalciferol, vitamin D2, 1,250 mcg (50,000 unit) capsule Take 50,000 Units by mouth once a week      ° levocetirizine (XYZAL) 5 MG tablet levocetirizine 5 mg tablet ° TAKE 1 TABLET BY MOUTH ONCE DAILY IN THE EVENING FOR 30 DAYS      ° levothyroxine (SYNTHROID) 100 MCG tablet TAKE 1 TABLET BY MOUTH ONCE DAILY AT BEDTIME ON AN EMPTY STOMACH      ° montelukast (SINGULAIR) 10 mg tablet montelukast 10 mg tablet ° TAKE 1 TABLET BY MOUTH IN THE EVENING      °  °No current facility-administered medications on file prior to visit.  °  °  °Family History  °     °Family  History  °Problem Relation Age of Onset  ° Hyperlipidemia (Elevated cholesterol) Father    °  °  °  °Social History  °  °   °Tobacco Use  °Smoking Status Never  °Smokeless Tobacco Never  °  °  °Social History  °Social History  °  °    °Socioeconomic History  ° Marital status: Single  °Tobacco Use  ° Smoking status: Never  ° Smokeless tobacco: Never  °Vaping Use  ° Vaping Use: Never used  °Substance and Sexual Activity  ° Alcohol use: Never  ° Drug use: Never  °  °  °  °Objective:  °  °  °   °Vitals:  °  08/04/21 1147  °BP: (!) 140/88  °Pulse: 101  °Temp: 36.8 °C (98.3 °F)  °SpO2: 97%  °Weight: (!) 128.7 kg (283 lb 12.8 oz)  °Height: 149.9 cm (4' 11")  °  °Body mass index is 57.32 kg/m². °  °Alert and well-appearing °Unlabored respirations °  °  °  °Assessment and Plan:  °Diagnoses and all orders for this visit: °  °Morbid obesity (CMS-HCC) °  °  °She remains a candidate for sleeve gastrectomy. We have previously discussed the surgery including technical aspects, the risks of bleeding, infection, pain, scarring, injury to intra-abdominal structures, staple line leak or abscess, chronic abdominal pain or nausea, new onset or worsened GERD, DVT/PE, pneumonia, heart attack, stroke, death, failure to reach weight loss goals and weight regain, hernia. Discussed the typical peri-, and postoperative course. Discussed the importance of lifelong behavioral changes to combat the chronic and relapsing disease which is obesity. Questions welcomed and answered. The patient appears to have an adequate understanding of the proposed procedure and risks/benefits.  Her parents are here with her today.  We will plan to proceed as scheduled next month. °HYPOTHYROID (E03.9) °Story: Recently started on Synthroid, followed by PCP °PSEUDOCHOLINESTERASE DEFICIENCY (E88.09) °Story: Cannot metabolize succinylcholine, I put this as an allergy in Epic °HISTORY OF PULMONARY EMBOLUS (PE) (Z86.711) °Story: Provoked, no longer on anticoagulation °   °Dunia Pringle AMANDA Lezlie Ritchey, MD  °  °

## 2021-09-19 NOTE — Anesthesia Preprocedure Evaluation (Signed)
Anesthesia Evaluation  Patient identified by MRN, date of birth, ID band Patient awake    Reviewed: Allergy & Precautions, NPO status , Patient's Chart, lab work & pertinent test results  History of Anesthesia Complications (+) PONV, PSEUDOCHOLINESTERASE DEFICIENCY, Family history of anesthesia reaction and history of anesthetic complications (mom has hx pseudocholinesterase deficiency- )  Airway Mallampati: III  TM Distance: >3 FB Neck ROM: Full    Dental no notable dental hx.    Pulmonary PE (2017)   Pulmonary exam normal breath sounds clear to auscultation       Cardiovascular negative cardio ROS Normal cardiovascular exam Rhythm:Regular Rate:Normal     Neuro/Psych PSYCHIATRIC DISORDERS Down's syndrome     GI/Hepatic negative GI ROS, Neg liver ROS,   Endo/Other  Hypothyroidism Morbid obesityBMI 54  Renal/GU negative Renal ROS  negative genitourinary   Musculoskeletal negative musculoskeletal ROS (+)   Abdominal (+) + obese,   Peds negative pediatric ROS (+)  Hematology negative hematology ROS (+)   Anesthesia Other Findings   Reproductive/Obstetrics negative OB ROS                             Anesthesia Physical Anesthesia Plan  ASA: 3  Anesthesia Plan: General   Post-op Pain Management: Tylenol PO (pre-op)*, Lidocaine infusion* and Ketamine IV*   Induction: Intravenous  PONV Risk Score and Plan: Ondansetron, Dexamethasone, Midazolam and Treatment may vary due to age or medical condition  Airway Management Planned: Video Laryngoscope Planned and Oral ETT  Additional Equipment: None  Intra-op Plan:   Post-operative Plan: Extubation in OR  Informed Consent:   Plan Discussed with:   Anesthesia Plan Comments:         Anesthesia Quick Evaluation

## 2021-09-19 NOTE — Transfer of Care (Signed)
Immediate Anesthesia Transfer of Care Note  Patient: Angela Odom  Procedure(s) Performed: Procedure(s): LAPAROSCOPIC GASTRIC SLEEVE RESECTION (N/A) UPPER GI ENDOSCOPY (N/A)  Patient Location: PACU  Anesthesia Type:General  Level of Consciousness: Patient easily awoken, sedated, comfortable, cooperative, following commands, responds to stimulation.   Airway & Oxygen Therapy: Patient spontaneously breathing, ventilating well, oxygen via simple oxygen mask.  Post-op Assessment: Report given to PACU RN, vital signs reviewed and stable, moving all extremities.   Post vital signs: Reviewed and stable.  Complications: No apparent anesthesia complications  Last Vitals:  Vitals Value Taken Time  BP 132/58 09/19/21 0915  Temp    Pulse 73 09/19/21 0915  Resp 19 09/19/21 0915  SpO2 100 % 09/19/21 0915  Vitals shown include unvalidated device data.  Last Pain:  Vitals:   09/19/21 0606  TempSrc:   PainSc: 0-No pain         Complications: No notable events documented.

## 2021-09-19 NOTE — Anesthesia Postprocedure Evaluation (Signed)
Anesthesia Post Note  Patient: Dannon Perlow  Procedure(s) Performed: LAPAROSCOPIC GASTRIC SLEEVE RESECTION UPPER GI ENDOSCOPY     Patient location during evaluation: PACU Anesthesia Type: General Level of consciousness: awake and alert, oriented and patient cooperative Pain management: pain level controlled Vital Signs Assessment: post-procedure vital signs reviewed and stable Respiratory status: spontaneous breathing, nonlabored ventilation and respiratory function stable Cardiovascular status: blood pressure returned to baseline and stable Postop Assessment: no apparent nausea or vomiting Anesthetic complications: no   No notable events documented.  Last Vitals:  Vitals:   09/19/21 1000 09/19/21 1035  BP:  (!) 154/86  Pulse: 71 67  Resp:  18  Temp:  36.6 C  SpO2: 100% 100%    Last Pain:  Vitals:   09/19/21 1035  TempSrc: Oral  PainSc:                  Lannie Fields

## 2021-09-19 NOTE — Op Note (Signed)
Preoperative diagnosis: laparoscopic sleeve gastrectomy  Postoperative diagnosis: Same   Procedure: Upper endoscopy   Surgeon: Nyazia Canevari, M.D.  Anesthesia: Gen.   Indications for procedure: This patient was undergoing a laparoscopic sleeve gastrectomy.   Description of procedure: The endoscopy was placed in the mouth and into the oropharynx and under endoscopic vision it was advanced to the esophagogastric junction.  The stomach was insufflated and no bleeding or bubbles were seen.  The GEJ was identified at 35 cm from the teeth. No bleeding or leaks were detected. The scope was withdrawn without difficulty.    Angela Odom, M.D. General, Bariatric, & Minimally Invasive Surgery [facility]   

## 2021-09-19 NOTE — Anesthesia Procedure Notes (Signed)
Procedure Name: Intubation Date/Time: 09/19/2021 7:31 AM Performed by: Deliah Boston, CRNA Pre-anesthesia Checklist: Patient identified, Emergency Drugs available, Suction available and Patient being monitored Patient Re-evaluated:Patient Re-evaluated prior to induction Oxygen Delivery Method: Circle system utilized Preoxygenation: Pre-oxygenation with 100% oxygen Induction Type: IV induction Ventilation: Mask ventilation without difficulty Laryngoscope Size: Glidescope and 3 Grade View: Grade I Tube type: Oral Number of attempts: 1 Airway Equipment and Method: Stylet and Video-laryngoscopy (glidescope stylet used) Placement Confirmation: ETT inserted through vocal cords under direct vision, positive ETCO2 and breath sounds checked- equal and bilateral Secured at: 22 cm Tube secured with: Tape Dental Injury: Teeth and Oropharynx as per pre-operative assessment  Difficulty Due To: Difficulty was anticipated, Difficult Airway- due to large tongue and Difficult Airway- due to anterior larynx

## 2021-09-20 ENCOUNTER — Encounter (HOSPITAL_COMMUNITY): Payer: Self-pay | Admitting: Surgery

## 2021-09-20 LAB — COMPREHENSIVE METABOLIC PANEL
ALT: 28 U/L (ref 0–44)
ALT: 32 U/L (ref 0–44)
AST: 39 U/L (ref 15–41)
AST: 31 U/L (ref 15–41)
Albumin: UNDETERMINED g/dL (ref 3.5–5.0)
Albumin: 3.6 g/dL (ref 3.5–5.0)
Alkaline Phosphatase: 46 U/L (ref 38–126)
Alkaline Phosphatase: 51 U/L (ref 38–126)
Anion gap: 10 (ref 5–15)
Anion gap: 8 (ref 5–15)
BUN: UNDETERMINED mg/dL (ref 6–20)
BUN: 11 mg/dL (ref 6–20)
CO2: 15 mmol/L — ABNORMAL LOW (ref 22–32)
CO2: 20 mmol/L — ABNORMAL LOW (ref 22–32)
Calcium: 8.4 mg/dL — ABNORMAL LOW (ref 8.9–10.3)
Calcium: 8.4 mg/dL — ABNORMAL LOW (ref 8.9–10.3)
Chloride: 109 mmol/L (ref 98–111)
Chloride: 105 mmol/L (ref 98–111)
Creatinine, Ser: UNDETERMINED mg/dL (ref 0.44–1.00)
Creatinine, Ser: 0.93 mg/dL (ref 0.44–1.00)
GFR, Estimated: >60 mL/min (ref >60)
Glucose, Bld: 107 mg/dL — ABNORMAL HIGH (ref 70–99)
Glucose, Bld: 102 mg/dL — ABNORMAL HIGH (ref 70–99)
Potassium: 5.1 mmol/L (ref 3.5–5.1)
Potassium: 4.1 mmol/L (ref 3.5–5.1)
Sodium: 134 mmol/L — ABNORMAL LOW (ref 135–145)
Sodium: 133 mmol/L — ABNORMAL LOW (ref 135–145)
Total Bilirubin: 1 mg/dL (ref 0.3–1.2)
Total Bilirubin: 0.6 mg/dL (ref 0.3–1.2)
Total Protein: 7 g/dL (ref 6.5–8.1)
Total Protein: 7 g/dL (ref 6.5–8.1)

## 2021-09-20 LAB — SURGICAL PATHOLOGY

## 2021-09-20 LAB — CBC WITH DIFFERENTIAL/PLATELET
Abs Immature Granulocytes: 0.08 10*3/uL — ABNORMAL HIGH (ref 0.00–0.07)
Basophils Absolute: 0 10*3/uL (ref 0.0–0.1)
Basophils Relative: 0 %
Eosinophils Absolute: 0 10*3/uL (ref 0.0–0.5)
Eosinophils Relative: 0 %
HCT: 41.9 % (ref 36.0–46.0)
Hemoglobin: 13.9 g/dL (ref 12.0–15.0)
Immature Granulocytes: 1 %
Lymphocytes Relative: 10 %
Lymphs Abs: 1.2 10*3/uL (ref 0.7–4.0)
MCH: 30.4 pg (ref 26.0–34.0)
MCHC: 33.2 g/dL (ref 30.0–36.0)
MCV: 91.7 fL (ref 80.0–100.0)
Monocytes Absolute: 0.5 10*3/uL (ref 0.1–1.0)
Monocytes Relative: 4 %
Neutro Abs: 10.3 10*3/uL — ABNORMAL HIGH (ref 1.7–7.7)
Neutrophils Relative %: 85 %
Platelets: 292 10*3/uL (ref 150–400)
RBC: 4.57 MIL/uL (ref 3.87–5.11)
RDW: 14.3 % (ref 11.5–15.5)
WBC: 12 10*3/uL — ABNORMAL HIGH (ref 4.0–10.5)
nRBC: 0 % (ref 0.0–0.2)

## 2021-09-20 LAB — MAGNESIUM: Magnesium: 2 mg/dL (ref 1.7–2.4)

## 2021-09-20 MED ORDER — ONDANSETRON 4 MG PO TBDP: 4.0000 mg | ORAL_TABLET | Freq: Four times a day (QID) | ORAL | 0 refills | Status: DC | PRN

## 2021-09-20 MED ORDER — ENOXAPARIN (LOVENOX) PATIENT EDUCATION KIT
PACK | Freq: Once | Status: AC
  Filled 2021-09-20: qty 1

## 2021-09-20 MED ORDER — TRAMADOL HCL 50 MG PO TABS: 50.0000 mg | ORAL_TABLET | Freq: Four times a day (QID) | ORAL | 0 refills | Status: DC | PRN

## 2021-09-20 MED ORDER — ENOXAPARIN SODIUM 60 MG/0.6ML IJ SOSY
60.0000 mg | PREFILLED_SYRINGE | Freq: Two times a day (BID) | INTRAMUSCULAR | Status: DC
  Administered 2021-09-20: 60 mg via SUBCUTANEOUS
  Filled 2021-09-20: qty 0.6

## 2021-09-20 MED ORDER — PANTOPRAZOLE SODIUM 40 MG PO TBEC: 40.0000 mg | DELAYED_RELEASE_TABLET | Freq: Every day | ORAL | 0 refills | Status: DC

## 2021-09-20 MED ORDER — APIXABAN 5 MG PO TABS: 5.0000 mg | ORAL_TABLET | Freq: Two times a day (BID) | ORAL | 0 refills | Status: AC

## 2021-09-20 MED ORDER — DOCUSATE SODIUM 100 MG PO CAPS: 100.0000 mg | ORAL_CAPSULE | Freq: Two times a day (BID) | ORAL | 0 refills | Status: DC

## 2021-09-20 MED ORDER — ENOXAPARIN SODIUM 60 MG/0.6ML IJ SOSY: 60.0000 mg | PREFILLED_SYRINGE | Freq: Two times a day (BID) | INTRAMUSCULAR | 0 refills | Status: DC

## 2021-09-20 MED ORDER — ACETAMINOPHEN 500 MG PO TABS: 1000.0000 mg | ORAL_TABLET | Freq: Three times a day (TID) | ORAL | 0 refills | Status: AC

## 2021-09-20 NOTE — Progress Notes (Signed)
S: No acute events. Reports feeling tired this morning but no pain or nausea.   O: Vitals, labs, intake/output, and orders reviewed at this time.  No fever, no tachycardia, mildly hypertensive, saturating 93% on room air.  P.o. 540, urine output 1900.  CMP reviewed, sodium and bicarb are down slightly but otherwise unremarkable.  WBC 12 (3.6 preop), hemoglobin 13.9 (12.3 preop) PRNs: Simethicone once yesterday afternoon but otherwise has not taken any as needed meds   Gen: A&Ox3, no distress  H&N: EOMI, atraumatic, neck supple Chest: unlabored respirations, RRR Abd: soft, nontender, nondistended, incision(s) c/d/i no cellulitis or hematoma Ext: warm, no edema Neuro: grossly normal  Lines/tubes/drains: PIV  A/P: Postop day 1 status post sleeve gastrectomy Continue clear liquids and protein shakes Continue frequent ambulation, SCDs while in bed, pulmonary toilet Lovenox increased to 60 mg every 12h which she will need to continue for 30 days after discharge Continue to monitor, possible dc later today pending progress   Romana Juniper, MD [facility], [place]

## 2021-09-20 NOTE — Plan of Care (Signed)
  Problem: Education: Goal: Ability to state signs and symptoms to report to health care provider will improve Outcome: Adequate for Discharge Goal: Knowledge of the prescribed self-care regimen will improve Outcome: Adequate for Discharge Goal: Knowledge of discharge needs will improve Outcome: Adequate for Discharge   Problem: Activity: Goal: Ability to tolerate increased activity will improve Outcome: Adequate for Discharge   Problem: Bowel/Gastric: Goal: Gastrointestinal status for postoperative course will improve Outcome: Adequate for Discharge Goal: Occurrences of nausea will decrease Outcome: Adequate for Discharge   Problem: Coping: Goal: Development of coping mechanisms to deal with changes in body function or appearance will improve Outcome: Adequate for Discharge   Problem: Fluid Volume: Goal: Maintenance of adequate hydration will improve Outcome: Adequate for Discharge   Problem: Nutritional: Goal: Nutritional status will improve Outcome: Adequate for Discharge   Problem: Clinical Measurements: Goal: Will show no signs or symptoms of venous thromboembolism Outcome: Adequate for Discharge Goal: Will remain free from infection Outcome: Adequate for Discharge Goal: Will show no signs of GI Leak Outcome: Adequate for Discharge   Problem: Respiratory: Goal: Will regain and/or maintain adequate ventilation Outcome: Adequate for Discharge   Problem: Pain Management: Goal: Pain level will decrease Outcome: Adequate for Discharge   Problem: Skin Integrity: Goal: Demonstration of wound healing without infection will improve Outcome: Adequate for Discharge   

## 2021-09-20 NOTE — Progress Notes (Signed)
Patient alert and oriented, pain is controlled. Patient is tolerating fluids, advanced to protein shake today, patient is tolerating well. Reviewed Gastric Bypass discharge instructions with patient and patient is able to articulate understanding. Provided information on BELT program, Support Group and [facility] outpatient pharmacy. All questions answered, will continue to monitor.   Lovenox teaching kit provided along with Lovenox education to patient and mother. Mother demonstrated technique to provide Lovenox injection.  Reviewed when to transition to Eliquis.

## 2021-09-20 NOTE — Discharge Instructions (Signed)
   GASTRIC BYPASS/SLEEVE  Home Care Instructions   These instructions are to help you care for yourself when you go home.  Call: If you have any problems. Call [phone] and ask for the surgeon on call If you need immediate help, come to the ER at [facility].  Tell the ER staff that you are a new post-op gastric bypass or gastric sleeve patient   Signs and symptoms to report: Severe vomiting or nausea If you cannot keep down clear liquids for longer than 1 day, call your surgeon  Abdominal pain that does not get better after taking your pain medication Fever over 100.4 F with chills Heart beating over 100 beats a minute Shortness of breath at rest Chest pain  Redness, swelling, drainage, or foul odor at incision (surgical) sites  If your incisions open or pull apart Swelling or pain in calf (lower leg) Diarrhea (Loose bowel movements that happen often), frequent watery, uncontrolled bowel movements Constipation, (no bowel movements for 3 days) if this happens: Pick one Milk of Magnesia, 2 tablespoons by mouth, 3 times a day for 2 days if needed Stop taking Milk of Magnesia once you have a bowel movement Call your doctor if constipation continues Or Miralax  (instead of Milk of Magnesia) following the label instructions Stop taking Miralax once you have a bowel movement Call your doctor if constipation continues Anything you think is not normal   Normal side effects after surgery: Unable to sleep at night or unable to focus Irritability or moody Being tearful (crying) or depressed These are common complaints, possibly related to your anesthesia medications that put you to sleep, stress of surgery, and change in lifestyle.  This usually goes away a few weeks after surgery.  If these feelings continue, call your primary care doctor.   Wound Care: You may have surgical glue, steri-strips, or staples over your incisions after surgery Surgical glue:  Looks like a clear film  over your incisions and will wear off a little at a time Steri-strips: Strips of tape over your incisions. You may notice a yellowish color on the skin under the steri-strips. This is used to make the   steri-strips stick better. Do not pull the steri-strips off - let them fall off Staples: Staples may be removed before you leave the hospital If you go home with staples, call [facility], [phone] at for an appointment with your surgeon's nurse to have staples removed 10 days after surgery. Showering: You may shower two (2) days after your surgery unless your surgeon tells you differently Wash gently around incisions with warm soapy water, rinse well, and gently pat dry  No tub baths until staples are removed, steri-strips fall off or glue is gone.    Medications: Medications should be liquid or crushed if larger than the size of a dime Extended release pills (medication that release a little bit at a time through the day) should NOT be crushed or cut. (examples include XL, ER, DR, SR) Depending on the size and number of medications you take, you may need to space (take a few throughout the day)/change the time you take your medications so that you do not over-fill your pouch (smaller stomach) Make sure you follow-up with your primary care doctor to make medication changes needed during rapid weight loss and life-style changes If you have diabetes, follow up with the doctor that orders your diabetes medication(s) within one week after surgery and check your blood sugar regularly.   Do not drive while taking prescription pain medication  It is ok to take Tylenol by the bottle instructions with your pain medicine or instead of your pain medicine as needed.  DO NOT TAKE NSAIDS (EXAMPLES OF NSAIDS:  IBUPROFREN/ NAPROXEN)  Diet:                    First 2 Weeks  You will see the dietician t about two (2) weeks after your surgery. The dietician will increase the types of foods you can eat  if you are handling liquids well: If you have severe vomiting or nausea and cannot keep down clear liquids lasting longer than 1 day, call your surgeon @ ([phone]) Protein Shake Drink at least 2 ounces of shake 5-6 times per day Each serving of protein shakes (usually 8 - 12 ounces) should have: 15 grams of protein  And no more than 5 grams of carbohydrate  Goal for protein each day: Men = 80 grams per day Women = 60 grams per day Protein powder may be added to fluids such as non-fat milk or Lactaid milk or unsweetened Soy/Almond milk (limit to 35 grams added protein powder per serving)  Hydration Slowly increase the amount of water and other clear liquids as tolerated (See Acceptable Fluids) Slowly increase the amount of protein shake as tolerated   Sip fluids slowly and throughout the day.  Do not use straws. May use sugar substitutes in small amounts (no more than 6 - 8 packets per day; i.e. Splenda)  Fluid Goal The first goal is to drink at least 8 ounces of protein shake/drink per day (or as directed by the nutritionist); some examples of protein shakes are Syntrax Nectar, Adkins Advantage, EAS Edge HP, and Unjury. See handout from pre-op Bariatric Education Class: Slowly increase the amount of protein shake you drink as tolerated You may find it easier to slowly sip shakes throughout the day It is important to get your proteins in first Your fluid goal is to drink 64 - 100 ounces of fluid daily It may take a few weeks to build up to this 32 oz (or more) should be clear liquids  And  32 oz (or more) should be full liquids (see below for examples) Liquids should not contain sugar, caffeine, or carbonation  Clear Liquids: Water or Sugar-free flavored water (i.e. Fruit H2O, Propel) Decaffeinated coffee or tea (sugar-free) Crystal Lite, Wyler's Lite, Minute Maid Lite Sugar-free Jell-O Bouillon or broth Sugar-free Popsicle:   *Less than 20 calories each; Limit 1 per  day  Full Liquids: Protein Shakes/Drinks + 2 choices per day of other full liquids Full liquids must be: No More Than 15 grams of Carbs per serving  No More Than 3 grams of Fat per serving Strained low-fat cream soup (except Cream of Potato or Tomato) Non-Fat milk Fat-free Lactaid Milk Unsweetened Soy Or Unsweetened Almond Milk Low Sugar yogurt (Dannon Lite & Fit, Greek yogurt; Oikos Triple Zero; Chobani Simply 100; Yoplait 100 calorie Greek - No Fruit on the Bottom)    Vitamins and Minerals Start 1 day after surgery unless otherwise directed by your surgeon 2 Chewable Bariatric Specific Multivitamin / Multimineral Supplement with iron (Example: Bariatric Advantage Multi EA) Chewable Calcium with Vitamin D-3 (Example: 3 Chewable Calcium Plus 600 with Vitamin D-3) Take 500 mg three (3) times a day for a total of 1500 mg each day Do not take all 3 doses of calcium at one time as it may cause constipation, and   you can only absorb 500 mg  at a time  Do not mix multivitamins containing iron with calcium supplements; take 2 hours apart Menstruating women and those with a history of anemia (a blood disease that causes weakness) may need extra iron Talk with your doctor to see if you need more iron Do not stop taking or change any vitamins or minerals until you talk to your dietitian or surgeon Your Dietitian and/or surgeon must approve all vitamin and mineral supplements   Activity and Exercise: Limit your physical activity as instructed by your doctor.  It is important to continue walking at home.  During this time, use these guidelines: Do not lift anything greater than ten (10) pounds for at least two (2) weeks Do not go back to work or drive until your surgeon says you can You may have sex when you feel comfortable  It is VERY important for female patients to use a reliable birth control method; fertility often increases after surgery  All hormonal birth control will be ineffective for 30  days after surgery due to medications given during surgery a barrier method must be used. Do not get pregnant for at least 18 months Start exercising as soon as your doctor tells you that you can Make sure your doctor approves any physical activity Start with a simple walking program Walk 5-15 minutes each day, 7 days per week.  Slowly increase until you are walking 30-45 minutes per day Consider joining our BELT program. [phone] or email belt@[email or web address]   Special Instructions Things to remember: Use your CPAP when sleeping if this applies to you  [facility] has two free Bariatric Surgery Support Groups that meet monthly The 3rd Thursday of each month, 6 pm, [facility] Classrooms  The 2nd Friday of each month, 11:45 am in the private dining room in the basement of [facility] It is very important to keep all follow up appointments with your surgeon, dietitian, primary care physician, and behavioral health practitioner Routine follow up schedule with your surgeon include appointments at 2-3 weeks, 6-8 weeks, 6 months, and 1 year at a minimum.  Your surgeon may request to see you more often.   After the first year, please follow up with your bariatric surgeon and dietitian at least once a year in order to maintain best weight loss results [facility]: [phone] [facility]: [phone] Bariatric Nurse Coordinator: [phone]      Reviewed and Endorsed  by [facility] Patient Education Committee, June, 2016 Edits Approved: Aug, 2018    

## 2021-09-20 NOTE — Final Progress Note (Signed)
Lovenox teaching kit was given to patient and family. Mother demonstrated correct administration of lovenox to [place]. Discharge instructions were reviewed and all questions were answered. Patient was taken to the main entrance via wheelchair.

## 2021-09-20 NOTE — Progress Notes (Signed)
Patient alert and oriented, Post op day 1.  Provided support and encouragement.  Encouraged pulmonary toilet, ambulation and small sips of liquids.  Completed bari clear liquid requirements started protein.  Discussed with patient and parents Lovenox injections.  Question raised if can transition to oral anticoagulant after 2 weeks of Lovenxo.  Will follow up with Dr Kae Heller.  All questions answered.  Will continue to monitor.

## 2021-09-21 NOTE — Progress Notes (Signed)
24hr fluid recall prior to discharge: 840mL.  Per dehydration protocol, will call pt to f/u within one week post op. 

## 2021-09-21 NOTE — Discharge Summary (Signed)
Physician Discharge Summary  Angela Odom TDD:[id number] DOB: 1985-11-06 DOA: 09/19/2021  PCP: Joanie Coddington, MD  Admit date: 09/19/2021 Discharge date: 09/20/2021  Recommendations for Outpatient Follow-up:    Follow-up Information     Clovis Riley, MD. Go on 10/14/2021.   Specialty: General Surgery Why: @ 920 am.  Please arrive at least 15 minutes prior to appointment time.  Thank you Contact information: [address] [place] [address] [phone]         Clovis Riley, MD Follow up on 11/09/2021.   Specialty: General Surgery Why: @ 1130 am. Please arrive 15 minutes prior to appointment time.  Thank you Contact information: [address] [place] [address] [phone]                Discharge Diagnoses:  Principal Problem:   Morbid obesity ([place])   Surgical Procedure: Laparoscopic Sleeve Gastrectomy, upper endoscopy  Discharge Condition: Good Disposition: Home  Diet recommendation: Postoperative sleeve gastrectomy diet (liquids only)  Filed Weights   09/19/21 0529  Weight: 121.7 kg     Hospital Course:  The patient was admitted for a planned laparoscopic sleeve gastrectomy. Please see operative note. Preoperatively the patient was given 5000 units of subcutaneous heparin for DVT prophylaxis. Postoperative prophylactic Lovenox dosing was started on the evening of postoperative day 0. ERAS protocol was used. On the evening of postoperative day 0, the patient was started on water and ice chips. On postoperative day 1 the patient had no fever or tachycardia and was tolerating water in their diet was gradually advanced throughout the day. The patient was ambulating without difficulty. Their vital signs are stable without fever or tachycardia. Their hemoglobin had remained stable.  The patient had received discharge instructions and counseling. 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  (This was 1991)   Ceclor [cefaclor] Hives   Sulfa Antibiotics Hives        Medication List     TAKE these medications    acetaminophen 325 MG tablet Commonly known as: TYLENOL Take 2 tablets (650 mg total) by mouth every 6 (six) hours as needed for mild pain (or Fever >/= 101). What changed: Another medication with the same name was added. Make sure you understand how and when to take each.   acetaminophen 500 MG tablet Commonly known as: TYLENOL Take 2 tablets (1,000 mg total) by mouth every 8 (eight) hours for 5 days. What changed: You were already taking a medication with the same name, and this prescription was added. Make sure you understand how and when to take each.   apixaban 5 MG Tabs tablet Commonly known as: ELIQUIS Take 1 tablet (5 mg total) by mouth 2 (two) times daily for 20 days. Start taking on: October 04, 2021   Azelastine HCl 137 MCG/SPRAY Soln Place 1-2 sprays into the nose in the morning and at bedtime.   calcium carbonate 500 MG chewable tablet Commonly known as: TUMS - dosed in mg elemental calcium Chew 1 tablet by mouth 2 (two) times daily.   docusate sodium 100 MG capsule Commonly known as: COLACE Take 1 capsule (100 mg total) by mouth 2 (two) times daily.   enoxaparin 60 MG/0.6ML injection Commonly known as:  LOVENOX Inject 0.6 mLs (60 mg total) into the skin every 12 (twelve) hours for 14 days.   levocetirizine 5 MG tablet Commonly known as: XYZAL Take 5 mg by mouth every evening.   levothyroxine 100 MCG tablet Commonly known as: SYNTHROID Take 100 mcg by mouth at bedtime.   montelukast 10 MG tablet Commonly known as: SINGULAIR Take 10 mg by mouth every evening.   multivitamin with minerals tablet Take 2 tablets by mouth daily.    ondansetron 4 MG disintegrating tablet Commonly known as: ZOFRAN-ODT Take 1 tablet (4 mg total) by mouth every 6 (six) hours as needed for nausea or vomiting.   pantoprazole 40 MG tablet Commonly known as: PROTONIX Take 1 tablet (40 mg total) by mouth daily.   traMADol 50 MG tablet Commonly known as: ULTRAM Take 1 tablet (50 mg total) by mouth every 6 (six) hours as needed (pain).   Vitamin D (Ergocalciferol) 1.25 MG (50000 UNIT) Caps capsule Commonly known as: DRISDOL Take 50,000 Units by mouth every Wednesday.        Follow-up Information     Clovis Riley, MD. Go on 10/14/2021.   Specialty: General Surgery Why: @ 920 am.  Please arrive at least 15 minutes prior to appointment time.  Thank you Contact information: [address] [place] [address] [phone]         Clovis Riley, MD Follow up on 11/09/2021.   Specialty: General Surgery Why: @ 1130 am. Please arrive 15 minutes prior to appointment time.  Thank you Contact information: [address] [place] [address] [phone]                  The results of significant diagnostics from this hospitalization (including imaging, microbiology, ancillary and laboratory) are listed below for reference.    Significant Diagnostic Studies: No results found.  Labs: Basic Metabolic Panel: Recent Labs  Lab 09/20/21 0407 09/20/21 0537  NA 134* 133*  K 5.1 4.1  CL 109 105  CO2 15* 20*  GLUCOSE 107* 102*  BUN QUANTITY NOT SUFFICIENT, UNABLE TO PERFORM TEST 11  CREATININE QUANTITY NOT SUFFICIENT, UNABLE TO PERFORM TEST 0.93  CALCIUM 8.4* 8.4*  MG 2.0  --    Liver Function Tests: Recent Labs  Lab 09/20/21 0407 09/20/21 0537  AST 39 31  ALT 28 32  ALKPHOS 46 51  BILITOT 1.0 0.6  PROT 7.0 7.0  ALBUMIN QUANTITY NOT SUFFICIENT, UNABLE TO PERFORM TEST 3.6    CBC: Recent Labs  Lab 09/20/21 0407  WBC 12.0*  NEUTROABS 10.3*  HGB 13.9  HCT 41.9   MCV 91.7  PLT 292    CBG: No results for input(s): GLUCAP in the last 168 hours.  Principal Problem:   Morbid obesity ([place])    Signed:  [facility], [place] [phone] 09/21/2021, 8:23 AM

## 2021-09-22 ENCOUNTER — Telehealth (HOSPITAL_COMMUNITY): Payer: Self-pay | Admitting: *Deleted

## 2021-09-22 NOTE — Telephone Encounter (Signed)
Discussed post op recovery with mother. ? ?1.  Tell me about your pain and pain management? ?Pt denies any pain. Mother states that pt has not needed to take any pain pills. ? ?2.  Let's talk about fluid intake.  How much total fluid are you taking in? ?Mother states that she is working to meet goal of 64 oz of fluid today.  Pt has been able to consume approx. 40-45oz of fluid per day since surgery.  Pt plans to increase clear liquids to meet fluid goals. Pt instructed to assess status and suggestions daily utilizing Hydration Action Plan on discharge folder and to call [facility] if in the "red zone".  ? ?3.  How much protein have you taken in the last 2 days? ?Mother states that she is working to meet goal of goal of 60g of protein today.  Pt has already consumed 1.5 protein shake(s).  Pt plans to drink remainder of protein throughout the rest of the day to meet goal. ? ?4.  Have you had nausea?  Tell me about when have experienced nausea and what you did to help? ?Mother denies nausea or vomiting. ?  ?5.  Has the frequency or color changed with your urine? ?Mother states that she is urinating "fine" with no changes in frequency or urgency.   ?  ?6.  Tell me what your incisions look like? ?"Incisions look fine". Mother denies a fever, chills.  Mother states incisions are not swollen, open, or draining.  Pt encouraged to call [facility] if incisions change. ?  ?7.  Have you been passing gas? BM? ?Mother states that he is having BMs. Last BM 09/21/21.   ? ?8.  If a problem or question were to arise who would you call?  Do you know contact numbers for [facility], [facility], and [facility]? ?Mother denies dehydration symptoms.  Pt can describe s/sx of dehydration.  Pt knows to call [facility] for surgical, [facility] for nutrition, and [facility] for non-urgent questions or concerns. ?  ?9.  How has the walking going? ?Mother states she is walking around and able to be active without difficulty. ?  ?10. Are you still using your incentive spirometer?  If so, how  often? ?Mother encouraged to instruct pt to use incentive spirometer, at least 10x every hour while awake until she sees the Psychologist, sport and exercise. ? ?11.  How are your vitamins and calcium going?  How are you taking them? ?Mother states that she is taking her supplements and vitamins without difficulty. ? ?Mother states that she is giving pt the Lovenox injections q12h without any difficulty.   ? ?Reminded patient that the first 30 days post-operatively are important for successful recovery.  Practice good hand hygiene, wearing a mask when appropriate (since optional in most places), and minimizing exposure to people who live outside of the home, especially if they are exhibiting any respiratory, GI, or illness-like symptoms.   ?

## 2021-10-03 ENCOUNTER — Encounter: Payer: Medicare Other | Attending: Surgery | Admitting: Skilled Nursing Facility1

## 2021-10-03 ENCOUNTER — Other Ambulatory Visit: Payer: Self-pay

## 2021-10-03 DIAGNOSIS — Z713 Dietary counseling and surveillance: Secondary | ICD-10-CM | POA: Insufficient documentation

## 2021-10-03 DIAGNOSIS — Z6841 Body Mass Index (BMI) 40.0 and over, adult: Secondary | ICD-10-CM | POA: Diagnosis not present

## 2021-10-03 NOTE — Progress Notes (Signed)
2 Week Post-Operative Nutrition Class ?  ?Patient was seen on 10/03/2021 for Post-Operative Nutrition education at [facility].  ? ?Pt states she is not as tired as she was previous to surgery. Pts parents have her on a physical activity schedule and not as much trouble breathing and no longer using an ankle brace.  ? ?Pt arrives reporting no issues.  ?  ?Surgery date: 09/19/2021 ?Surgery type: sleeve gastrectomy  ?Start weight at [facility]: 273 pounds ?Weight today: 258.7 pounds ?Bowel Habits: Every day to every other day no complaints ?  ?Body Composition Scale 10/03/2021  ?Current Body Weight 258.7  ?Total Body Fat % 48.4  ?Visceral Fat 18  ?Fat-Free Mass % 51.5  ? Total Body Water % 40.2  ?Muscle-Mass lbs 28.8  ?BMI 52.4  ?Body Fat Displacement   ?       Torso  lbs 77.7  ?       Left Leg  lbs 15.5  ?       Right Leg  lbs 15.5  ?       Left Arm  lbs 7.7  ?       Right Arm   lbs 7.7  ? ? ?  ?The following the learning objectives were met by the patient during this course: ?Identifies Phase 3 (Soft, High Proteins) Dietary Goals and will begin from 2 weeks post-operatively to 2 months post-operatively ?Identifies appropriate sources of fluids and proteins  ?Identifies appropriate fat sources and healthy verses unhealthy fat types   ?States protein recommendations and appropriate sources post-operatively ?Identifies the need for appropriate texture modifications, mastication, and bite sizes when consuming solids ?Identifies appropriate fat consumption and sources ?Identifies appropriate multivitamin and calcium sources post-operatively ?Describes the need for physical activity post-operatively and will follow MD recommendations ?States when to call healthcare provider regarding medication questions or post-operative complications ?  ?Handouts given during class include: ?Phase 3A: Soft, High Protein Diet Handout ?Phase 3 High Protein Meals ?Healthy Fats ?  ?Follow-Up Plan: ?Patient will  follow-up at [facility] in 6 weeks for 2 month post-op nutrition visit for diet advancement per MD. ? ?

## 2021-11-03 ENCOUNTER — Encounter: Payer: Medicare Other | Attending: Surgery | Admitting: Skilled Nursing Facility1

## 2021-11-03 DIAGNOSIS — Z713 Dietary counseling and surveillance: Secondary | ICD-10-CM | POA: Insufficient documentation

## 2021-11-03 DIAGNOSIS — Z6841 Body Mass Index (BMI) 40.0 and over, adult: Secondary | ICD-10-CM | POA: Insufficient documentation

## 2021-11-03 NOTE — Progress Notes (Signed)
Bariatric Nutrition Follow-Up Visit ?Medical Nutrition Therapy  ? ? ?NUTRITION ASSESSMENT ?  ? ?Surgery date: 09/19/2021 ?Surgery type: sleeve gastrectomy  ?Start weight at [facility]: 273 pounds ?Weight today: 249.6 pounds ?  ?Body Composition Scale 10/03/2021 11/03/2021  ?Current Body Weight 258.7 249.6  ?Total Body Fat % 48.4 47.7  ?Visceral Fat 18 17  ?Fat-Free Mass % 51.5 52.2  ? Total Body Water % 40.2 40.6  ?Muscle-Mass lbs 28.8 28.7  ?BMI 52.4 50.5  ?Body Fat Displacement    ?       Torso  lbs 77.7 73.8  ?       Left Leg  lbs 15.5 14.7  ?       Right Leg  lbs 15.5 14.7  ?       Left Arm  lbs 7.7 7.3  ?       Right Arm   lbs 7.7 7.3  ? ?Clinical  ?Medical hx: Pulmonary emboli, Downs Syndrome, narrow esophagus ?Medications: reviewed - azelastine nasal spray, montelukast, levocetirizine, levothyroxin ?Labs: reviewed - did not see any recent  ?Notable signs/symptoms: none noted ?Any previous deficiencies? No ?  ?Lifestyle & Dietary Hx ? ?Pt's father states she has had good breathing and less wheezing and has energy an stays awake all the way to [facility].  Pt's father stated she used to fall asleep immediately on the drive. ?Pt had one episode of diarrhea around the time she ate sugar free chocolate pudding. ?Fluids: Propel, vitamin water, water, milk, beef broth ?Replace shake with protein 20 if you want it but otherwise do not need it. ?Fruits were also added, due to patients activity level and increased calorie needs. ?Patient was super excited for non-starchy vegetables and fruits. ?Patient is really excited to start swimming. ?Pt looks forward to adding in sushi eventually ?24 Hour Recall: ? ? ? ?Estimated daily fluid intake: 64 oz ?Estimated daily protein intake: 80 g ?Supplements: multi and calcium  ?Current average weekly physical activity: 1,2 3, 4 4:30 5:30 17 laps  starting in May [facility]   ?  ?Post-Op Goals/ Signs/ Symptoms ?Using straws: no ?Drinking while eating:  no ?Chewing/swallowing difficulties: no ?Changes in vision: no ?Changes to mood/headaches: no ?Hair loss/changes to skin/nails: no ?Difficulty focusing/concentrating: no ?Sweating: no ?Limb weakness: no ?Dizziness/lightheadedness: no ?Palpitations: no  ?Carbonated/caffeinated beverages: no ?N/V/D/C/Gas: no ?Abdominal pain: no ?Dumping syndrome: no ? ?  ?NUTRITION DIAGNOSIS  ?Overweight/obesity ([place]-3.3) related to past poor dietary habits and physical inactivity as evidenced by completed bariatric surgery and following dietary guidelines for continued weight loss and healthy nutrition status. ?  ?  ?NUTRITION INTERVENTION ?Nutrition counseling (C-1) and education (E-2) to facilitate bariatric surgery goals, including: ?Diet advancement to the next phase (phase 4) now including non-starchy vegetables ?Fruits were also added, due to patients activity level and increased calorie needs. ?The importance of consuming adequate calories as well as certain nutrients daily due to the body's need for essential vitamins, minerals, and fats ?The importance of daily physical activity and to reach a goal of at least 150 minutes of moderate to vigorous physical activity weekly (or as directed by their physician) due to benefits such as increased musculature and improved lab values ?The importance of intuitive eating specifically learning hunger-satiety cues and understanding the importance of learning a new body: The importance of mindful eating to avoid grazing behaviors  ?Reduce protein supplements due healthy appetite for protein foods. ? ?Handouts Provided Include  ?Phase  4 handout ? ?Learning Style & Readiness for Change ?Teaching method utilized: Visual & Auditory  ?Demonstrated degree of understanding via: Teach Back  ?Readiness Level: Ready ?Barriers to learning/adherence to lifestyle change: none identified  ? ?RD's Notes for Next Visit ?Assess adherence to pt chosen goals ? ? ? ?MONITORING & EVALUATION ?Dietary intake,  weekly physical activity, body weight ? ?Next Steps ?Patient is to follow-up in July for post-op follow-up. ?  ?

## 2021-11-14 ENCOUNTER — Ambulatory Visit: Payer: Medicare Other | Admitting: Skilled Nursing Facility1

## 2021-11-16 ENCOUNTER — Ambulatory Visit: Payer: Medicare Other | Admitting: Skilled Nursing Facility1

## 2021-11-28 ENCOUNTER — Ambulatory Visit: Payer: Medicare Other | Admitting: Skilled Nursing Facility1

## 2022-02-06 ENCOUNTER — Encounter: Payer: Medicare Other | Attending: Family Medicine | Admitting: Skilled Nursing Facility1

## 2022-02-06 DIAGNOSIS — Z713 Dietary counseling and surveillance: Secondary | ICD-10-CM | POA: Diagnosis not present

## 2022-02-06 DIAGNOSIS — Z6841 Body Mass Index (BMI) 40.0 and over, adult: Secondary | ICD-10-CM | POA: Diagnosis not present

## 2022-02-06 DIAGNOSIS — I2699 Other pulmonary embolism without acute cor pulmonale: Secondary | ICD-10-CM | POA: Diagnosis not present

## 2022-02-06 DIAGNOSIS — Q909 Down syndrome, unspecified: Secondary | ICD-10-CM | POA: Diagnosis not present

## 2022-02-06 DIAGNOSIS — E669 Obesity, unspecified: Secondary | ICD-10-CM

## 2022-02-06 NOTE — Progress Notes (Signed)
Bariatric Nutrition Follow-Up Visit Medical Nutrition Therapy    NUTRITION ASSESSMENT    Surgery date: 09/19/2021 Surgery type: sleeve gastrectomy  Start weight at [facility]: 273 pounds Weight today: 232.6 pounds   Body Composition Scale 10/03/2021 11/03/2021 02/06/2022  Current Body Weight 258.7 249.6 232.6  Total Body Fat % 48.4 47.7 46.1  Visceral Fat 18 17 16   Fat-Free Mass % 51.5 52.2 53.8   Total Body Water % 40.2 40.6 41.4  Muscle-Mass lbs 28.8 28.7 28.6  BMI 52.4 50.5 47.1  Body Fat Displacement            Torso  lbs 77.7 73.8 66.5         Left Leg  lbs 15.5 14.7 13.3         Right Leg  lbs 15.5 14.7 13.3         Left Arm  lbs 7.7 7.3 6.6         Right Arm   lbs 7.7 7.3 6.6   Clinical  Medical hx: Pulmonary emboli, Downs Syndrome, narrow esophagus Medications: reviewed - azelastine nasal spray, montelukast, levocetirizine, levothyroxin Labs: reviewed - did not see any recent  Notable signs/symptoms: none noted Any previous deficiencies? No   Lifestyle & Dietary Hx  According to her food record this dietitian estimates she is getting in about 58 grams of protein, went through diet recall and helped pt and her mother create a balanced day with appropriate carbohydrate, fats, vegetables and proteins.   24 Hour Recall:    Estimated daily fluid intake: 64 oz Estimated daily protein intake: 80 g Supplements: multi and calcium  Current average weekly physical activity: walking at her apartment 3 days and 1 set of stairs a week and swimming 4 days a week    Post-Op Goals/ Signs/ Symptoms Using straws: no Drinking while eating: no Chewing/swallowing difficulties: no Changes in vision: no Changes to mood/headaches: no Hair loss/changes to skin/nails: some loss to hair Difficulty focusing/concentrating: no Sweating: no Limb weakness: no Dizziness/lightheadedness: no Palpitations: no  Carbonated/caffeinated beverages: no N/V/D/C/Gas: no Abdominal pain: no Dumping  syndrome: no    NUTRITION DIAGNOSIS  Overweight/obesity ([place]-3.3) related to past poor dietary habits and physical inactivity as evidenced by completed bariatric surgery and following dietary guidelines for continued weight loss and healthy nutrition status.     NUTRITION INTERVENTION Nutrition counseling (C-1) and education (E-2) to facilitate bariatric surgery goals, including: Fruits were also added, due to patients activity level and increased calorie needs. The importance of consuming adequate calories as well as certain nutrients daily due to the body's need for essential vitamins, minerals, and fats The importance of daily physical activity and to reach a goal of at least 150 minutes of moderate to vigorous physical activity weekly (or as directed by their physician) due to benefits such as increased musculature and improved lab values The importance of intuitive eating specifically learning hunger-satiety cues and understanding the importance of learning a new body: The importance of mindful eating to avoid grazing behaviors  Encouraged patient to honor their body's internal hunger and fullness cues.  Throughout the day, check in mentally and rate hunger. Stop eating when satisfied not full regardless of how much food is left on the plate.  Get more if still hungry 20-30 minutes later.  The key is to honor satisfaction so throughout the meal, rate fullness factor and stop when comfortably satisfied not physically full. The key is to honor hunger and fullness without any feelings of guilt or  shame.  Pay attention to what the internal cues are, rather than any external factors. This will enhance the confidence you have in listening to your own body and following those internal cues enabling you to increase how often you eat when you are hungry not out of appetite and stop when you are satisfied not full.  Encouraged pt to continue to eat balanced meals inclusive of non starchy vegetables 2 times a  day 7 days a week Encouraged pt to choose lean protein sources: limiting beef, pork, sausage, hotdogs, and lunch meat Encourage pt to choose healthy fats such as plant based limiting animal fats Encouraged pt to continue to drink a minium 64 fluid ounces with half being plain water to satisfy proper hydration  Handouts Provided Include  Bariatric MyPlate  Learning Style & Readiness for Change Teaching method utilized: Visual & Auditory  Demonstrated degree of understanding via: Teach Back  Readiness Level: Ready Barriers to learning/adherence to lifestyle change: none identified   RD's Notes for Next Visit Assess adherence to pt chosen goals    MONITORING & EVALUATION Dietary intake, weekly physical activity, body weight  Next Steps Patient is to follow-up in 3 months

## 2022-05-02 ENCOUNTER — Encounter: Payer: Self-pay | Admitting: Skilled Nursing Facility1

## 2022-05-02 ENCOUNTER — Encounter: Payer: Medicare Other | Attending: Family Medicine | Admitting: Skilled Nursing Facility1

## 2022-05-02 DIAGNOSIS — Z713 Dietary counseling and surveillance: Secondary | ICD-10-CM | POA: Insufficient documentation

## 2022-05-02 DIAGNOSIS — E669 Obesity, unspecified: Secondary | ICD-10-CM | POA: Insufficient documentation

## 2022-05-02 DIAGNOSIS — Z9884 Bariatric surgery status: Secondary | ICD-10-CM | POA: Diagnosis not present

## 2022-05-02 DIAGNOSIS — Z6841 Body Mass Index (BMI) 40.0 and over, adult: Secondary | ICD-10-CM | POA: Insufficient documentation

## 2022-05-02 NOTE — Progress Notes (Signed)
Bariatric Nutrition Follow-Up Visit Medical Nutrition Therapy    NUTRITION ASSESSMENT    Surgery date: 09/19/2021 Surgery type: sleeve gastrectomy  Start weight at [facility]: 273 pounds Weight today: 222.8 pounds   Body Composition Scale 10/03/2021 11/03/2021 02/06/2022 05/02/2022  Current Body Weight 258.7 249.6 232.6 222.8  Total Body Fat % 48.4 47.7 46.1 45.1  Visceral Fat 18 17 16 15   Fat-Free Mass % 51.5 52.2 53.8 54.8   Total Body Water % 40.2 40.6 41.4 41.9  Muscle-Mass lbs 28.8 28.7 28.6 28.5  BMI 52.4 50.5 47.1 45.1  Body Fat Displacement             Torso  lbs 77.7 73.8 66.5 62.3         Left Leg  lbs 15.5 14.7 13.3 12.4         Right Leg  lbs 15.5 14.7 13.3 12.4         Left Arm  lbs 7.7 7.3 6.6 6.2         Right Arm   lbs 7.7 7.3 6.6 6.2   Clinical  Medical hx: Pulmonary emboli, Downs Syndrome, narrow esophagus Medications: reviewed - azelastine nasal spray, montelukast, levocetirizine, levothyroxin Labs: reviewed - did not see any recent  Notable signs/symptoms: none noted Any previous deficiencies? No   Lifestyle & Dietary Hx   Pt states she is working with a Work to get a job.  Pt states she has been Swimming 4 days  a week and when not swimming walk.    24 Hour Recall:    Estimated daily fluid intake: 52-55 oz Estimated daily protein intake: 80 g Supplements: multi and calcium  Current average weekly physical activity: walking at her apartment 3 days and 1 set of stairs a week and swimming 4 days a week    Post-Op Goals/ Signs/ Symptoms Using straws: no Drinking while eating: no Chewing/swallowing difficulties: no Changes in vision: no Changes to mood/headaches: no Hair loss/changes to skin/nails: some loss to hair Difficulty focusing/concentrating: no Sweating: no Limb weakness: no Dizziness/lightheadedness: no Palpitations: no  Carbonated/caffeinated beverages: no N/V/D/C/Gas: no Abdominal pain: no Dumping syndrome: no     NUTRITION DIAGNOSIS  Overweight/obesity ([place]-3.3) related to past poor dietary habits and physical inactivity as evidenced by completed bariatric surgery and following dietary guidelines for continued weight loss and healthy nutrition status.     NUTRITION INTERVENTION Nutrition counseling (C-1) and education (E-2) to facilitate bariatric surgery goals, including: Fruits were also added, due to patients activity level and increased calorie needs. The importance of consuming adequate calories as well as certain nutrients daily due to the body's need for essential vitamins, minerals, and fats The importance of daily physical activity and to reach a goal of at least 150 minutes of moderate to vigorous physical activity weekly (or as directed by their physician) due to benefits such as increased musculature and improved lab values The importance of intuitive eating specifically learning hunger-satiety cues and understanding the importance of learning a new body: The importance of mindful eating to avoid grazing behaviors  Encouraged patient to honor their body's internal hunger and fullness cues.  Throughout the day, check in mentally and rate hunger. Stop eating when satisfied not full regardless of how much food is left on the plate.  Get more if still hungry 20-30 minutes later.  The key is to honor satisfaction so throughout the meal, rate fullness factor and stop when comfortably satisfied not physically full. The key is to honor  hunger and fullness without any feelings of guilt or shame.  Pay attention to what the internal cues are, rather than any external factors. This will enhance the confidence you have in listening to your own body and following those internal cues enabling you to increase how often you eat when you are hungry not out of appetite and stop when you are satisfied not full.  Encouraged pt to continue to eat balanced meals inclusive of non starchy vegetables 2 times a day 7 days a  week Encouraged pt to choose lean protein sources: limiting beef, pork, sausage, hotdogs, and lunch meat Encourage pt to choose healthy fats such as plant based limiting animal fats Encouraged pt to continue to drink a minium 64 fluid ounces with half being plain water to satisfy proper hydration -Add in 1 fruit and 2 starches daily  Handouts Previously Provided Include  Bariatric MyPlate  Learning Style & Readiness for Change Teaching method utilized: Visual & Auditory  Demonstrated degree of understanding via: Teach Back  Readiness Level: Ready Barriers to learning/adherence to lifestyle change: none identified   RD's Notes for Next Visit Assess adherence to pt chosen goals    MONITORING & EVALUATION Dietary intake, weekly physical activity, body weight  Next Steps Patient is to follow-up in February

## 2022-07-19 ENCOUNTER — Emergency Department (HOSPITAL_COMMUNITY): Payer: Medicare Other

## 2022-07-19 ENCOUNTER — Emergency Department (HOSPITAL_COMMUNITY)
Admission: EM | Admit: 2022-07-19 | Discharge: 2022-07-19 | Disposition: A | Discharge status: Home / Self Care | Payer: Medicare Other | Attending: Emergency Medicine | Admitting: Emergency Medicine

## 2022-07-19 ENCOUNTER — Other Ambulatory Visit: Payer: Self-pay

## 2022-07-19 DIAGNOSIS — Q909 Down syndrome, unspecified: Secondary | ICD-10-CM | POA: Insufficient documentation

## 2022-07-19 DIAGNOSIS — F79 Unspecified intellectual disabilities: Secondary | ICD-10-CM | POA: Diagnosis not present

## 2022-07-19 DIAGNOSIS — J189 Pneumonia, unspecified organism: Secondary | ICD-10-CM | POA: Insufficient documentation

## 2022-07-19 DIAGNOSIS — E039 Hypothyroidism, unspecified: Secondary | ICD-10-CM | POA: Diagnosis not present

## 2022-07-19 DIAGNOSIS — R109 Unspecified abdominal pain: Secondary | ICD-10-CM | POA: Diagnosis present

## 2022-07-19 DIAGNOSIS — Z1152 Encounter for screening for COVID-19: Secondary | ICD-10-CM | POA: Insufficient documentation

## 2022-07-19 LAB — RESP PANEL BY RT-PCR (RSV, FLU A&B, COVID)  RVPGX2
Influenza A by PCR: NEGATIVE
Influenza B by PCR: NEGATIVE
Resp Syncytial Virus by PCR: NEGATIVE
SARS Coronavirus 2 by RT PCR: NEGATIVE

## 2022-07-19 MED ORDER — AMOXICILLIN-POT CLAVULANATE 400-57 MG/5ML PO SUSR: 875.0000 mg | Freq: Two times a day (BID) | ORAL | 0 refills | Status: AC

## 2022-07-19 MED ORDER — ACETAMINOPHEN 325 MG PO TABS
ORAL_TABLET | ORAL | Status: AC
  Filled 2022-07-19: qty 2

## 2022-07-19 MED ORDER — ACETAMINOPHEN 325 MG PO TABS
650.0000 mg | ORAL_TABLET | Freq: Once | ORAL | Status: AC
  Administered 2022-07-19: 650 mg via ORAL
  Filled 2022-07-19: qty 2

## 2022-07-19 MED ORDER — AMOXICILLIN-POT CLAVULANATE 875-125 MG PO TABS: 1.0000 | ORAL_TABLET | Freq: Two times a day (BID) | ORAL | 0 refills | Status: DC

## 2022-07-19 MED ORDER — DOXYCYCLINE HYCLATE 100 MG PO CAPS: 100.0000 mg | ORAL_CAPSULE | Freq: Two times a day (BID) | ORAL | 0 refills | Status: DC

## 2022-07-19 MED ORDER — DOXYCYCLINE HYCLATE 100 MG PO TABS
100.0000 mg | ORAL_TABLET | Freq: Once | ORAL | Status: AC
Start: 2022-07-19 — End: 2022-07-19
  Administered 2022-07-19: 100 mg via ORAL
  Filled 2022-07-19: qty 1

## 2022-07-19 MED ORDER — AMOXICILLIN-POT CLAVULANATE 875-125 MG PO TABS
1.0000 | ORAL_TABLET | Freq: Once | ORAL | Status: AC
  Administered 2022-07-19: 1 via ORAL
  Filled 2022-07-19: qty 1

## 2022-07-19 NOTE — ED Provider Notes (Signed)
[facility] Provider Note   CSN: [id number] Arrival date & time: 07/19/22  1325     History  Chief Complaint  Patient presents with   Flank Pain    Angela Odom is a 36 y.o. female.  Patient is a 36 year old female with a history of Down syndrome, hypothyroidism, obesity with recent gastric sleeve in February, recurrent pneumonia, history of PEs in 2017 after being on OCPs and a long flight with 6 months of anticoagulation and no further recurrence who is presenting today with complaint of right-sided flank pain and chills.  She has also noticed a cough today.  She reports yesterday she felt fine.  She has not been feeling ill prior to today.  She has been eating and drinking normally.  She denies any shortness of breath.  Her mom reports that she lives in [place] and when she got here to see her she has appeared much better than what she expected to find her.  She has been mentating normally and has had her normal energy level.  The history is provided by the patient and a parent.  Flank Pain       Home Medications Prior to Admission medications   Medication Sig Start Date End Date Taking? Authorizing Provider  amoxicillin-clavulanate (AUGMENTIN) 400-57 MG/5ML suspension Take 10.9 mLs (875 mg total) by mouth 2 (two) times daily for 5 days. 07/19/22 07/24/22 Yes Gwyneth Sprout, MD  doxycycline (VIBRAMYCIN) 100 MG capsule Take 1 capsule (100 mg total) by mouth 2 (two) times daily. 07/19/22  Yes Gwyneth Sprout, MD  acetaminophen (TYLENOL) 325 MG tablet Take 2 tablets (650 mg total) by mouth every 6 (six) hours as needed for mild pain (or Fever >/= 101). 11/02/16   Gambino, Valentina Shaggy, MD  Azelastine HCl 137 MCG/SPRAY SOLN Place 1-2 sprays into the nose in the morning and at bedtime. 05/05/21   [provider]  calcium carbonate (TUMS - DOSED IN MG ELEMENTAL CALCIUM) 500 MG chewable tablet Chew 1 tablet by mouth 2 (two) times daily.     [provider]  docusate sodium (COLACE) 100 MG capsule Take 1 capsule (100 mg total) by mouth 2 (two) times daily. 09/20/21   Berna Bue, MD  enoxaparin (LOVENOX) 60 MG/0.6ML injection Inject 0.6 mLs (60 mg total) into the skin every 12 (twelve) hours for 14 days. 09/20/21 10/04/21  Berna Bue, MD  levocetirizine (XYZAL) 5 MG tablet Take 5 mg by mouth every evening. 05/05/21   [provider]  levothyroxine (SYNTHROID) 100 MCG tablet Take 100 mcg by mouth at bedtime. 07/21/21   [provider]  montelukast (SINGULAIR) 10 MG tablet Take 10 mg by mouth every evening. 06/01/21   [provider]  Multiple Vitamins-Minerals (MULTIVITAMIN WITH MINERALS) tablet Take 2 tablets by mouth daily.    [provider]  ondansetron (ZOFRAN-ODT) 4 MG disintegrating tablet Take 1 tablet (4 mg total) by mouth every 6 (six) hours as needed for nausea or vomiting. 09/20/21   Berna Bue, MD  pantoprazole (PROTONIX) 40 MG tablet Take 1 tablet (40 mg total) by mouth daily. 09/20/21   Berna Bue, MD  traMADol (ULTRAM) 50 MG tablet Take 1 tablet (50 mg total) by mouth every 6 (six) hours as needed (pain). 09/20/21   Berna Bue, MD  Vitamin D, Ergocalciferol, (DRISDOL) 1.25 MG (50000 UNIT) CAPS capsule Take 50,000 Units by mouth every Wednesday. 04/18/21   [provider]  Allergies    Succinylcholine, Ceclor [cefaclor], and Sulfa antibiotics    Review of Systems   Review of Systems  Genitourinary:  Positive for flank pain.    Physical Exam Updated Vital Signs BP (!) 126/105   Pulse 85   Temp (!) 101 F (38.3 C) (Oral)   Resp 15   Ht 4\' 11"  (1.499 m)   Wt 101 kg   SpO2 96%   BMI 44.97 kg/m  Physical Exam Vitals and nursing note reviewed.  Constitutional:      General: She is not in acute distress.    Appearance: She is well-developed.  HENT:     Head: Normocephalic and atraumatic.     Comments: Facial features  consistent with Down syndrome    Mouth/Throat:     Mouth: Mucous membranes are dry.  Eyes:     Pupils: Pupils are equal, round, and reactive to light.  Cardiovascular:     Rate and Rhythm: Normal rate and regular rhythm.     Heart sounds: Normal heart sounds. No murmur heard.    No friction rub.  Pulmonary:     Effort: Pulmonary effort is normal.     Breath sounds: Examination of the right-middle field reveals rhonchi. Examination of the right-lower field reveals rhonchi. Rhonchi present. No wheezing or rales.  Abdominal:     General: Bowel sounds are normal. There is no distension.     Palpations: Abdomen is soft.     Tenderness: There is no abdominal tenderness. There is no guarding or rebound.  Musculoskeletal:        General: No tenderness. Normal range of motion.     Comments: No edema.  No calf pain or swelling  Skin:    General: Skin is warm and dry.     Findings: No rash.  Neurological:     Mental Status: She is alert and oriented to person, place, and time. Mental status is at baseline.     Cranial Nerves: No cranial nerve deficit.  Psychiatric:        Mood and Affect: Mood normal.        Behavior: Behavior normal.     ED Results / Procedures / Treatments   Labs (all labs ordered are listed, but only abnormal results are displayed) Labs Reviewed  RESP PANEL BY RT-PCR (RSV, FLU A&B, COVID)  [id number]    EKG None  Radiology DG Chest 2 View  Result Date: 07/19/2022 CLINICAL DATA:  Chest pain EXAM: CHEST - 2 VIEW COMPARISON:  Chest 10/14/2020 FINDINGS: Normal development of patchy airspace disease right lung base and right middle lobe. Probable pneumonia. No effusion. Left lung clear. Negative for heart failure. IMPRESSION: Patchy airspace disease in the right middle lobe and right lung base. Probable pneumonia. Electronically Signed   By: 10/16/2020 M.D.   On: 07/19/2022 16:34    Procedures Procedures    Medications Ordered in ED Medications  acetaminophen  (TYLENOL) tablet 650 mg (0 mg Oral Hold 07/19/22 1956)  doxycycline (VIBRA-TABS) tablet 100 mg (100 mg Oral Given 07/19/22 1927)  amoxicillin-clavulanate (AUGMENTIN) 875-125 MG per tablet 1 tablet (1 tablet Oral Given 07/19/22 1927)    ED Course/ Medical Decision Making/ A&P                           Medical Decision Making Amount and/or Complexity of Data Reviewed Independent Historian: parent    Details: mom Radiology: ordered and independent interpretation performed. Decision-making  details documented in ED Course.  Risk Prescription drug management.   Pt with multiple medical problems and comorbidities and presenting today with a complaint that caries a high risk for morbidity and mortality.  Patient is here today with right-sided flank pain cough and fever up to 102.8.  Patient reports symptoms started today.  She has had history for recurrent pneumonia in the past and also had PEs in 2017.  Today concerned that patient has pneumonia.  Lower suspicion for PE as patient has no longer been on OCPs and has had no recent travel.  She denies any unilateral leg pain or swelling. I have independently visualized and interpreted pt's images today.  Chest x-ray today with findings consistent with right middle and lower lobe pneumonia.  At this time lower suspicion for PE.  Discussed the findings with the patient and her mom who is present at bedside.  Discussed we cannot 100% rule out PE however more likely that it is pneumonia today.  Patient had been stuck multiple times and is a tough stick and cannot get blood.  She does not wish to have blood work drawn.  O2 sats are 93 to 94% on room air.  Other than having pain when she takes a deep breath she is well-appearing.  She denies any abdominal pain, vomiting or urinary symptoms.  Discussed with them antibiotics and close return precautions.  Her mom will stay with her overnight to ensure she is not getting any worse.  They do not or wish to have any  further testing at this time.  Covid/flu and rsv are neg.  Pt covered with doxy and augmentin.        Final Clinical Impression(s) / ED Diagnoses Final diagnoses:  Community acquired pneumonia of right lower lobe of lung    Rx / DC Orders ED Discharge Orders          Ordered    doxycycline (VIBRAMYCIN) 100 MG capsule  2 times daily        07/19/22 1906    amoxicillin-clavulanate (AUGMENTIN) 875-125 MG tablet  Every 12 hours,   Status:  Discontinued        07/19/22 1906    amoxicillin-clavulanate (AUGMENTIN) 400-57 MG/5ML suspension  2 times daily        07/19/22 1948              Gwyneth Sprout, MD 07/19/22 2051

## 2022-07-19 NOTE — ED Notes (Signed)
Patient got choked on amoxicillin and vomited into cup with pills.  New pills overrode from pyxis and she did not take any of the previously pulled meds.

## 2022-07-19 NOTE — Discharge Instructions (Addendum)
It appears that you have pneumonia today which is causing the right-sided pain in your ribs.  If you start feeling worse with shortness of breath, vomiting, feeling weak not able to eat or drink you should return to the emergency room.  I will call you later this evening if you are COVID or flu test is positive.

## 2022-07-19 NOTE — ED Triage Notes (Addendum)
Pt reports right flank pain radiating to back "hurts when I breath in" Episode of "shaking and dizzy" pta Denies urinary symptoms

## 2022-07-19 NOTE — ED Provider Triage Note (Signed)
Emergency Medicine Provider Triage Evaluation Note  Angela Odom , a 36 y.o. female  was evaluated in triage.  Pt complains of pain in upper back when she takes a deep breath.  History of pneumonia, PE.  Not on anticoagulation.  Febrile in triage but denies known fever prior to arrival.  Denies cough.  Denies history of kidney stones, dysuria, abdominal pain, nausea..  Review of Systems  Positive: As above Negative: As above  Physical Exam  BP 109/71   Pulse 97   Temp (!) 102.8 F (39.3 C) (Oral)   Resp 16   Ht 4\' 11"  (1.499 m)   Wt 101 kg   SpO2 93%   BMI 44.97 kg/m  Gen:   Awake, no distress   Resp:  Normal effort MSK:   Moves extremities without difficulty  Other:    Medical Decision Making  Medically screening exam initiated at 4:12 PM.  Appropriate orders placed.  [name] was informed that the remainder of the evaluation will be completed by another provider, this initial triage assessment does not replace that evaluation, and the importance of remaining in the ED until their evaluation is complete.     Waylan Rocher, PA-C 07/19/22 [phone]

## 2022-07-19 NOTE — ED Notes (Signed)
Unable to collect Labs pt is a hard stick

## 2022-08-29 ENCOUNTER — Encounter: Payer: Self-pay | Admitting: Skilled Nursing Facility1

## 2022-08-29 ENCOUNTER — Encounter: Payer: Medicare HMO | Attending: Surgery | Admitting: Skilled Nursing Facility1

## 2022-08-29 VITALS — Ht 59.0 in | Wt 225.5 lb

## 2022-08-29 DIAGNOSIS — Q909 Down syndrome, unspecified: Secondary | ICD-10-CM | POA: Insufficient documentation

## 2022-08-29 DIAGNOSIS — Z6841 Body Mass Index (BMI) 40.0 and over, adult: Secondary | ICD-10-CM | POA: Insufficient documentation

## 2022-08-29 DIAGNOSIS — E669 Obesity, unspecified: Secondary | ICD-10-CM | POA: Diagnosis present

## 2022-08-29 NOTE — Progress Notes (Signed)
Bariatric Nutrition Follow-Up Visit Medical Nutrition Therapy    NUTRITION ASSESSMENT    Surgery date: 09/19/2021 Surgery type: sleeve gastrectomy  Start weight at [facility]: 273 pounds Weight today: 225.5 pounds   Body Composition Scale 10/03/2021 11/03/2021 02/06/2022 05/02/2022 08/29/2022  Current Body Weight 258.7 249.6 232.6 222.8 225.5  Total Body Fat % 48.4 47.7 46.1 45.1 45.5  Visceral Fat 18 17 16 15 15   Fat-Free Mass % 51.5 52.2 53.8 54.8 54.4   Total Body Water % 40.2 40.6 41.4 41.9 41.7  Muscle-Mass lbs 28.8 28.7 28.6 28.5 28.4  BMI 52.4 50.5 47.1 45.1 45.6  Body Fat Displacement              Torso  lbs 77.7 73.8 66.5 62.3 63.5         Left Leg  lbs 15.5 14.7 13.3 12.4 12.7         Right Leg  lbs 15.5 14.7 13.3 12.4 12.7         Left Arm  lbs 7.7 7.3 6.6 6.2 6.3         Right Arm   lbs 7.7 7.3 6.6 6.2 6.3   Clinical  Medical hx: Pulmonary emboli, Downs Syndrome, narrow esophagus Medications: reviewed - azelastine nasal spray, montelukast, levocetirizine, levothyroxin Labs: reviewed - did not see any recent  Notable signs/symptoms: none noted Any previous deficiencies? No   Lifestyle & Dietary Hx   Pt states she works in [employer] at [employer] and [employer]. Pt states her boyfriend hung out for new years. Angela Odom 9 years. 3 days a week 4 hours and walking around her apartment.  Only gets headaches with overeated or stressed. Goes to bed around 12 and wakes around 11am;  8am for work pt states she gets free lunch at work.      24 Hour Recall:    Estimated daily fluid intake: 52-55 oz Estimated daily protein intake: 80 g Supplements: multi and calcium  Current average weekly physical activity: walking at her apartment 3 days and 1 set of stairs a week and swimming 4 days a week    Post-Op Goals/ Signs/ Symptoms Using straws: no Drinking while eating: no Chewing/swallowing difficulties: no Changes in vision: no Changes to  mood/headaches: no Hair loss/changes to skin/nails: some loss to hair Difficulty focusing/concentrating: no Sweating: no Limb weakness: no Dizziness/lightheadedness: no Palpitations: no  Carbonated/caffeinated beverages: no N/V/D/C/Gas: no Abdominal pain: no Dumping syndrome: no    NUTRITION DIAGNOSIS  Overweight/obesity ([place]-3.3) related to past poor dietary habits and physical inactivity as evidenced by completed bariatric surgery and following dietary guidelines for continued weight loss and healthy nutrition status.     NUTRITION INTERVENTION: continued Nutrition counseling (C-1) and education (E-2) to facilitate bariatric surgery goals, including: Fruits were also added, due to patients activity level and increased calorie needs. The importance of consuming adequate calories as well as certain nutrients daily due to the body's need for essential vitamins, minerals, and fats The importance of daily physical activity and to reach a goal of at least 150 minutes of moderate to vigorous physical activity weekly (or as directed by their physician) due to benefits such as increased musculature and improved lab values The importance of intuitive eating specifically learning hunger-satiety cues and understanding the importance of learning a new body: The importance of mindful eating to avoid grazing behaviors  Encouraged patient to honor their body's internal hunger and fullness cues.  Throughout the day, check in mentally and  rate hunger. Stop eating when satisfied not full regardless of how much food is left on the plate.  Get more if still hungry 20-30 minutes later.  The key is to honor satisfaction so throughout the meal, rate fullness factor and stop when comfortably satisfied not physically full. The key is to honor hunger and fullness without any feelings of guilt or shame.  Pay attention to what the internal cues are, rather than any external factors. This will enhance the confidence you  have in listening to your own body and following those internal cues enabling you to increase how often you eat when you are hungry not out of appetite and stop when you are satisfied not full.  Encouraged pt to continue to eat balanced meals inclusive of non starchy vegetables 2 times a day 7 days a week Encouraged pt to choose lean protein sources: limiting beef, pork, sausage, hotdogs, and lunch meat Encourage pt to choose healthy fats such as plant based limiting animal fats Encouraged pt to continue to drink a minium 64 fluid ounces with half being plain water to satisfy proper hydration -have yogurt for breakfast on days you work  Handouts Previously Provided Include  Bariatric MyPlate  Learning Style & Readiness for Change Teaching method utilized: Visual & Auditory  Demonstrated degree of understanding via: Teach Back  Readiness Level: Ready Barriers to learning/adherence to lifestyle change: none identified   RD's Notes for Next Visit Assess adherence to pt chosen goals    MONITORING & EVALUATION Dietary intake, weekly physical activity, body weight  Next Steps Patient is to follow-up in 6 months

## 2023-02-20 ENCOUNTER — Encounter: Payer: Medicare HMO | Attending: Surgery | Admitting: Skilled Nursing Facility1

## 2023-02-20 ENCOUNTER — Encounter: Payer: Self-pay | Admitting: Skilled Nursing Facility1

## 2023-02-20 DIAGNOSIS — Z6841 Body Mass Index (BMI) 40.0 and over, adult: Secondary | ICD-10-CM | POA: Insufficient documentation

## 2023-02-20 NOTE — Progress Notes (Unsigned)
Bariatric Nutrition Follow-Up Visit Medical Nutrition Therapy    NUTRITION ASSESSMENT    Surgery date: 09/19/2021 Surgery type: sleeve gastrectomy  Start weight at [facility]: 273 pounds Weight today: 230.3 pounds   Body Composition Scale 11/03/2021 02/06/2022 05/02/2022 08/29/2022 02/20/2023  Current Body Weight 249.6 232.6 222.8 225.5 230.3  Total Body Fat % 47.7 46.1 45.1 45.5 45.9  Visceral Fat 17 16 15 15 15   Fat-Free Mass % 52.2 53.8 54.8 54.4 54   Total Body Water % 40.6 41.4 41.9 41.7 41.5  Muscle-Mass lbs 28.7 28.6 28.5 28.4 28.6  BMI 50.5 47.1 45.1 45.6 46.6  Body Fat Displacement              Torso  lbs 73.8 66.5 62.3 63.5 65.6         Left Leg  lbs 14.7 13.3 12.4 12.7 13.1         Right Leg  lbs 14.7 13.3 12.4 12.7 13.1         Left Arm  lbs 7.3 6.6 6.2 6.3 6.5         Right Arm   lbs 7.3 6.6 6.2 6.3 6.5   Clinical  Medical hx: Pulmonary emboli, Downs Syndrome, narrow esophagus Medications: reviewed - azelastine nasal spray, montelukast, levocetirizine, levothyroxin Labs: reviewed - did not see any recent  Notable signs/symptoms: none noted Any previous deficiencies? No   Lifestyle & Dietary Hx  Pt sates she still spaces her drink and meals apart. Pt state sshe is having some hip pain that she has had.  Pts portions may have grown a bit, pts mother states she will help her with this.    24 Hour Recall:  Breakfast 11am: sandwich (ham spinach cheese) round thins and 17 grapes  Snack:  Lunch 2-3: 1/2 cup cottage cheese and fruit cup Snack: Dinner: spinach omolet (2 eggs) + cheese + sugar free ketchup Dessert: greek yogurt and sugar free jello  Beverages: oatmilk, water, V8 juice tomato or fruit juice  Estimated daily fluid intake: 52-55 oz Estimated daily protein intake: 80 g Supplements: multi and calcium  Current average weekly physical activity: walking at her apartment 3 days and 1 set of stairs a week and swimming 2 days a week, wii, elipital     Post-Op Goals/ Signs/ Symptoms Using straws: no Drinking while eating: no Chewing/swallowing difficulties: no Changes in vision: no Changes to mood/headaches: no Hair loss/changes to skin/nails: some loss to hair Difficulty focusing/concentrating: no Sweating: no Limb weakness: no Dizziness/lightheadedness: no Palpitations: no  Carbonated/caffeinated beverages: no N/V/D/C/Gas: no Abdominal pain: no Dumping syndrome: no    NUTRITION DIAGNOSIS  Overweight/obesity ([place]-3.3) related to past poor dietary habits and physical inactivity as evidenced by completed bariatric surgery and following dietary guidelines for continued weight loss and healthy nutrition status.     NUTRITION INTERVENTION: continued Nutrition counseling (C-1) and education (E-2) to facilitate bariatric surgery goals, including: Fruits were also added, due to patients activity level and increased calorie needs. The importance of consuming adequate calories as well as certain nutrients daily due to the body's need for essential vitamins, minerals, and fats The importance of daily physical activity and to reach a goal of at least 150 minutes of moderate to vigorous physical activity weekly (or as directed by their physician) due to benefits such as increased musculature and improved lab values The importance of intuitive eating specifically learning hunger-satiety cues and understanding the importance of learning a new body: The importance of mindful eating to avoid grazing  behaviors  Encouraged patient to honor their body's internal hunger and fullness cues.  Throughout the day, check in mentally and rate hunger. Stop eating when satisfied not full regardless of how much food is left on the plate.  Get more if still hungry 20-30 minutes later.  The key is to honor satisfaction so throughout the meal, rate fullness factor and stop when comfortably satisfied not physically full. The key is to honor hunger and fullness without  any feelings of guilt or shame.  Pay attention to what the internal cues are, rather than any external factors. This will enhance the confidence you have in listening to your own body and following those internal cues enabling you to increase how often you eat when you are hungry not out of appetite and stop when you are satisfied not full.  Encouraged pt to continue to eat balanced meals inclusive of non starchy vegetables 2 times a day 7 days a week Encouraged pt to choose lean protein sources: limiting beef, pork, sausage, hotdogs, and lunch meat Encourage pt to choose healthy fats such as plant based limiting animal fats Encouraged pt to continue to drink a minium 64 fluid ounces with half being plain water to satisfy proper hydration -have yogurt for breakfast on days you work  -inorporate more vegetbles   Handouts Previously Provided Include  Bariatric MyPlate  Learning Style & Readiness for Change Teaching method utilized: Visual & Auditory  Demonstrated degree of understanding via: Teach Back  Readiness Level: Ready Barriers to learning/adherence to lifestyle change: none identified   RD's Notes for Next Visit Assess adherence to pt chosen goals    MONITORING & EVALUATION Dietary intake, weekly physical activity, body weight  Next Steps Patient is to follow-up in 6 January

## 2023-04-26 ENCOUNTER — Encounter (HOSPITAL_COMMUNITY): Payer: Self-pay | Admitting: *Deleted

## 2023-07-06 ENCOUNTER — Telehealth (INDEPENDENT_AMBULATORY_CARE_PROVIDER_SITE_OTHER): Payer: Self-pay | Admitting: Otolaryngology

## 2023-07-06 NOTE — Telephone Encounter (Signed)
Left voicemail for patient to confirm 12/16 appt @ 1:10 pm with Dr. Suszanne Conners

## 2023-07-09 ENCOUNTER — Encounter (INDEPENDENT_AMBULATORY_CARE_PROVIDER_SITE_OTHER): Payer: Self-pay

## 2023-07-09 ENCOUNTER — Ambulatory Visit (INDEPENDENT_AMBULATORY_CARE_PROVIDER_SITE_OTHER): Payer: Medicare HMO | Admitting: Otolaryngology

## 2023-07-09 VITALS — Ht 59.0 in | Wt 230.0 lb

## 2023-07-09 DIAGNOSIS — H6123 Impacted cerumen, bilateral: Secondary | ICD-10-CM | POA: Diagnosis not present

## 2023-07-09 NOTE — Progress Notes (Signed)
Patient ID: Angela Odom, female   DOB: 1986-04-03, 37 y.o.   MRN: [id number]  Procedure: Bilateral cerumen disimpaction.   Indication: Cerumen impaction, resulting in ear discomfort and conductive hearing loss.   Description: The patient is placed supine on the operating table. Under the operating microscope, the right ear canal is examined and is noted to be impacted with cerumen. The cerumen is carefully removed with a combination of suction catheters, cerumen curette, and alligator forceps. After the cerumen removal, the ear canal and tympanic membrane are noted to be normal. No middle ear effusion is noted. The same procedure is then repeated on the left side without exception. The patient tolerated the procedure well.  Follow-up care:  The patient is instructed not to use Q-tips to clean the ear canals. The patient will follow up in 6 months.

## 2023-07-30 ENCOUNTER — Encounter: Payer: Medicare HMO | Attending: Surgery | Admitting: Skilled Nursing Facility1

## 2023-07-30 ENCOUNTER — Encounter: Payer: Self-pay | Admitting: Skilled Nursing Facility1

## 2023-07-30 DIAGNOSIS — Z713 Dietary counseling and surveillance: Secondary | ICD-10-CM | POA: Diagnosis not present

## 2023-07-30 DIAGNOSIS — Z6841 Body Mass Index (BMI) 40.0 and over, adult: Secondary | ICD-10-CM | POA: Insufficient documentation

## 2023-07-30 NOTE — Progress Notes (Signed)
 Bariatric Nutrition Follow-Up Visit Medical Nutrition Therapy    NUTRITION ASSESSMENT    Surgery date: 09/19/2021 Surgery type: sleeve gastrectomy  Start weight at [facility]: 273 pounds Weight today: 230.3 pounds   Body Composition Scale 11/03/2021 02/06/2022 05/02/2022 08/29/2022 02/20/2023 07/29/2022  Current Body Weight 249.6 232.6 222.8 225.5 230.3 230.3  Total Body Fat % 47.7 46.1 45.1 45.5 45.9 46.1  Visceral Fat 17 16 15 15 15 16   Fat-Free Mass % 52.2 53.8 54.8 54.4 54 53.8   Total Body Water  % 40.6 41.4 41.9 41.7 41.5 41.4  Muscle-Mass lbs 28.7 28.6 28.5 28.4 28.6 28.4  BMI 50.5 47.1 45.1 45.6 46.6 46.6  Body Fat Displacement               Torso  lbs 73.8 66.5 62.3 63.5 65.6 65.8         Left Leg  lbs 14.7 13.3 12.4 12.7 13.1 13.1         Right Leg  lbs 14.7 13.3 12.4 12.7 13.1 13.1         Left Arm  lbs 7.3 6.6 6.2 6.3 6.5 6.5         Right Arm   lbs 7.3 6.6 6.2 6.3 6.5 6.5   Clinical  Medical hx: Pulmonary emboli, Downs Syndrome, narrow esophagus Medications: reviewed - azelastine nasal spray, montelukast, levocetirizine, levothyroxin Labs: none in EMR Notable signs/symptoms: none noted Any previous deficiencies? No   Lifestyle & Dietary Hx  Pts father arrives stating he would like to break the weight stall the pt is currently having. Pt states she feels really great and has a ton of energy not needing any naps. Pts father states she is not as out of breath but would like to see her using the stairs more often and walking more often.  Pt sates she still spaces her drink and meals apart. Pt states she is having some hip pain that she has had complaining of it in the appt but stating that does not keep her from being active.  In previous appt Pts mother states the pts portions may have grown a bit, pts mother states she will help her with this but in today's appt pts father states he never thought her portions were any larger than right after surgery.  Pts father states  portions are not to blame.   Pt states she has been Once a week walking to [employer], working at [employer] 4 hours and walking from the bus to the apartment. Pts father states the pt has not been walking as much as she did post surgery.   Works 12-4, 4 days a week.   24 Hour Recall:  Breakfast 11am: 2 egg + spinach + cheese and fruit cup (drank the juice)  Snack:  Lunch 2-3: 1/2 cup cottage cheese + quest cookie or protein shake  and fruit cup or leftovers  Snack: sometimes quest cookie  Dinner 6-7pm: gyro meat and salad  Dessert: greek yogurt and sugar free jello or popcicle   Beverages: oatmilk, water , V8 juice tomato or fruit juice, propel, Bai  Estimated daily fluid intake: 52-55 oz Estimated daily protein intake: 80 g Supplements: multi and calcium  Current average weekly physical activity: walking at her apartment 3 days and 1 set of stairs a week and swimming 2 days a week, wii, elipital    Post-Op Goals/ Signs/ Symptoms Using straws: no Drinking while eating: no Chewing/swallowing difficulties: no Changes in vision: no Changes to mood/headaches: no Hair  loss/changes to skin/nails: some loss to hair Difficulty focusing/concentrating: no Sweating: no Limb weakness: no Dizziness/lightheadedness: no Palpitations: no  Carbonated/caffeinated beverages: no N/V/D/C/Gas: no Abdominal pain: no Dumping syndrome: no    NUTRITION DIAGNOSIS  Overweight/obesity ([employer]-3.3) related to past poor dietary habits and physical inactivity as evidenced by completed bariatric surgery and following dietary guidelines for continued weight loss and healthy nutrition status.     NUTRITION INTERVENTION: continued Nutrition counseling (C-1) and education (E-2) to facilitate bariatric surgery goals, including: Fruits were also added, due to patients activity level and increased calorie needs. The importance of consuming adequate calories as well as certain nutrients daily due to the body's need  for essential vitamins, minerals, and fats The importance of daily physical activity and to reach a goal of at least 150 minutes of moderate to vigorous physical activity weekly (or as directed by their physician) due to benefits such as increased musculature and improved lab values The importance of intuitive eating specifically learning hunger-satiety cues and understanding the importance of learning a new body: The importance of mindful eating to avoid grazing behaviors  Encouraged patient to honor their body's internal hunger and fullness cues.  Throughout the day, check in mentally and rate hunger. Stop eating when satisfied not full regardless of how much food is left on the plate.  Get more if still hungry 20-30 minutes later.  The key is to honor satisfaction so throughout the meal, rate fullness factor and stop when comfortably satisfied not physically full. The key is to honor hunger and fullness without any feelings of guilt or shame.  Pay attention to what the internal cues are, rather than any external factors. This will enhance the confidence you have in listening to your own body and following those internal cues enabling you to increase how often you eat when you are hungry not out of appetite and stop when you are satisfied not full.  Encouraged pt to continue to eat balanced meals inclusive of non starchy vegetables 2 times a day 7 days a week Encouraged pt to choose lean protein sources: limiting beef, pork, sausage, hotdogs, and lunch meat Encourage pt to choose healthy fats such as plant based limiting animal fats Encouraged pt to continue to drink a minium 64 fluid ounces with half being plain water  to satisfy proper hydration  Handouts Previously Provided Include  Bariatric MyPlate  Learning Style & Readiness for Change Teaching method utilized: Visual & Auditory  Demonstrated degree of understanding via: Teach Back  Readiness Level: Action Barriers to learning/adherence to  lifestyle change: none identified   RD's Notes for Next Visit Assess adherence to pt chosen goals   MONITORING & EVALUATION Dietary intake, weekly physical activity, body weight  Next Steps Patient is to follow-up in 6 months

## 2024-01-07 ENCOUNTER — Ambulatory Visit (INDEPENDENT_AMBULATORY_CARE_PROVIDER_SITE_OTHER): Payer: Medicare HMO | Admitting: Otolaryngology

## 2024-01-07 ENCOUNTER — Other Ambulatory Visit: Payer: Self-pay | Admitting: Orthopedic Surgery

## 2024-01-07 ENCOUNTER — Encounter (INDEPENDENT_AMBULATORY_CARE_PROVIDER_SITE_OTHER): Payer: Self-pay | Admitting: Otolaryngology

## 2024-01-07 VITALS — BP 109/73 | HR 66

## 2024-01-07 DIAGNOSIS — H6123 Impacted cerumen, bilateral: Secondary | ICD-10-CM

## 2024-01-07 NOTE — Progress Notes (Signed)
 Patient ID: Angela Odom, female   DOB: 1986-06-29, 38 y.o.   MRN: [id number]  Procedure: Bilateral cerumen disimpaction.   Indication: Cerumen impaction, resulting in ear discomfort and conductive hearing loss.   Description: The patient is placed supine on the operating table. Under the operating microscope, the right ear canal is examined and is noted to be impacted with cerumen. The cerumen is carefully removed with a combination of suction catheters, cerumen curette, and alligator forceps. After the cerumen removal, the ear canal and tympanic membrane are noted to be normal. No middle ear effusion is noted. The same procedure is then repeated on the left side without exception. The patient tolerated the procedure well.  Follow-up care:  The patient will follow up in 6 months.

## 2024-01-09 ENCOUNTER — Encounter (HOSPITAL_COMMUNITY): Payer: Self-pay | Admitting: Orthopedic Surgery

## 2024-01-09 ENCOUNTER — Other Ambulatory Visit: Payer: Self-pay

## 2024-01-09 NOTE — Progress Notes (Signed)
 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  She is s/p sleeve gastrectomy 09/19/2021 without complication.  Per anesthesia notes, elective GlideScope was used due to large tongue and anterior larynx.  She will need day of surgery labs and evaluation.    Angela Odom [facility]/Anesthesiology Phone [phone] 01/09/2024 9:45 AM

## 2024-01-09 NOTE — Progress Notes (Addendum)
 SDW CALL  Patient was given pre-op instructions over the phone. The opportunity was given for the patient to ask questions. No further questions asked. Patient verbalized understanding of instructions given.   PCP - Bertell Broach MD Cardiologist -   PPM/ICD - denies Device Orders - n/a Rep Notified - n/a  Chest x-ray -12-11-22 EKG - 07-25-22 Stress Test - denies ECHO - denies Cardiac Cath - denies  Sleep Study - denies CPAP - n/a  DM -denies  Blood Thinner Instructions:denies Aspirin Instructions:n/a  ERAS Protcol - clear liquids until 6:00   COVID TEST- n/a   Anesthesia review: yes, hx Pseudocholinesterase deficiency, Down's Syndrome, HX of PE.  Information provided by patient and patient's mom  Patient denies shortness of breath, fever, cough and chest pain over the phone call   All instructions explained to the patient, with a verbal understanding of the material. Patient agrees to go over the instructions while at home for a better understanding.

## 2024-01-09 NOTE — Anesthesia Preprocedure Evaluation (Addendum)
 Anesthesia Evaluation  Patient identified by MRN, date of birth, ID band Patient awake    Reviewed: Allergy & Precautions, NPO status , Patient's Chart, lab work & pertinent test results  History of Anesthesia Complications (+) PONV, PSEUDOCHOLINESTERASE DEFICIENCY, Family history of anesthesia reaction and history of anesthetic complications (mom has hx pseudocholinesterase deficiency- )  Airway Mallampati: III  TM Distance: >3 FB Neck ROM: Full    Dental no notable dental hx.    Pulmonary PE (2017)   Pulmonary exam normal breath sounds clear to auscultation       Cardiovascular negative cardio ROS Normal cardiovascular exam Rhythm:Regular Rate:Normal     Neuro/Psych  PSYCHIATRIC DISORDERS      Down's syndrome     GI/Hepatic negative GI ROS, Neg liver ROS,,,  Endo/Other  Hypothyroidism  Class 3 obesityBMI 54  Renal/GU negative Renal ROS  negative genitourinary   Musculoskeletal negative musculoskeletal ROS (+)    Abdominal  (+) + obese  Peds negative pediatric ROS (+)  Hematology negative hematology ROS (+)   Anesthesia Other Findings   Reproductive/Obstetrics negative OB ROS                             Anesthesia Physical Anesthesia Plan  ASA: 3  Anesthesia Plan: General   Post-op Pain Management:    Induction: Intravenous  PONV Risk Score and Plan: 4 or greater and Ondansetron , Dexamethasone , Midazolam , Treatment may vary due to age or medical condition and Droperidol  Airway Management Planned: LMA  Additional Equipment: None  Intra-op Plan:   Post-operative Plan: Extubation in OR  Informed Consent:   Plan Discussed with:   Anesthesia Plan Comments: (PAT note by Rudy Costain, PA-C: 38 year old female with pertinent history including Down syndrome, PONV, pseudocholinesterase deficiency, hypothyroidism, prior PE 2017 felt provoked by oral contraceptive and immobility  after prolonged flight, class III obesity BMI 45.  She is s/p sleeve gastrectomy 09/19/2021 without complication.  Per anesthesia notes, elective GlideScope was used due to large tongue and anterior larynx.  She will need day of surgery labs and evaluation.   )        Anesthesia Quick Evaluation

## 2024-01-10 ENCOUNTER — Other Ambulatory Visit: Payer: Self-pay

## 2024-01-10 ENCOUNTER — Encounter (HOSPITAL_COMMUNITY)
Admission: RE | Disposition: A | Discharge status: Home / Self Care | Payer: Self-pay | Source: Ambulatory Visit | Attending: Orthopedic Surgery

## 2024-01-10 ENCOUNTER — Ambulatory Visit (HOSPITAL_COMMUNITY): Payer: Self-pay | Admitting: Physician Assistant

## 2024-01-10 ENCOUNTER — Ambulatory Visit (HOSPITAL_COMMUNITY)
Admission: RE | Admit: 2024-01-10 | Discharge: 2024-01-10 | Disposition: A | Discharge status: Home / Self Care | Source: Ambulatory Visit | Attending: Orthopedic Surgery | Admitting: Orthopedic Surgery

## 2024-01-10 ENCOUNTER — Ambulatory Visit (HOSPITAL_BASED_OUTPATIENT_CLINIC_OR_DEPARTMENT_OTHER): Payer: Self-pay | Admitting: Physician Assistant

## 2024-01-10 ENCOUNTER — Ambulatory Visit (HOSPITAL_COMMUNITY)

## 2024-01-10 DIAGNOSIS — Y798 Miscellaneous orthopedic devices associated with adverse incidents, not elsewhere classified: Secondary | ICD-10-CM | POA: Insufficient documentation

## 2024-01-10 DIAGNOSIS — T8484XA Pain due to internal orthopedic prosthetic devices, implants and grafts, initial encounter: Secondary | ICD-10-CM | POA: Diagnosis not present

## 2024-01-10 DIAGNOSIS — Z86711 Personal history of pulmonary embolism: Secondary | ICD-10-CM | POA: Insufficient documentation

## 2024-01-10 DIAGNOSIS — E8809 Other disorders of plasma-protein metabolism, not elsewhere classified: Secondary | ICD-10-CM | POA: Diagnosis not present

## 2024-01-10 DIAGNOSIS — Y838 Other surgical procedures as the cause of abnormal reaction of the patient, or of later complication, without mention of misadventure at the time of the procedure: Secondary | ICD-10-CM | POA: Diagnosis not present

## 2024-01-10 DIAGNOSIS — Z6841 Body Mass Index (BMI) 40.0 and over, adult: Secondary | ICD-10-CM | POA: Insufficient documentation

## 2024-01-10 DIAGNOSIS — Z9884 Bariatric surgery status: Secondary | ICD-10-CM | POA: Insufficient documentation

## 2024-01-10 DIAGNOSIS — E66813 Obesity, class 3: Secondary | ICD-10-CM | POA: Diagnosis not present

## 2024-01-10 DIAGNOSIS — Q909 Down syndrome, unspecified: Secondary | ICD-10-CM | POA: Diagnosis not present

## 2024-01-10 DIAGNOSIS — E039 Hypothyroidism, unspecified: Secondary | ICD-10-CM | POA: Diagnosis not present

## 2024-01-10 HISTORY — PX: HARDWARE REMOVAL: SHX979

## 2024-01-10 LAB — CBC
HCT: 42 % (ref 36.0–46.0)
Hemoglobin: 13.8 g/dL (ref 12.0–15.0)
MCH: 31.8 pg (ref 26.0–34.0)
MCHC: 32.9 g/dL (ref 30.0–36.0)
MCV: 96.8 fL (ref 80.0–100.0)
Platelets: UNDETERMINED 10*3/uL (ref 150–400)
RBC: 4.34 MIL/uL (ref 3.87–5.11)
RDW: 13.6 % (ref 11.5–15.5)
WBC: 3.7 10*3/uL — ABNORMAL LOW (ref 4.0–10.5)
nRBC: 0 % (ref 0.0–0.2)

## 2024-01-10 LAB — POCT I-STAT, CHEM 8
BUN: 18 mg/dL (ref 6–20)
Calcium, Ion: 1.18 mmol/L (ref 1.15–1.40)
Chloride: 102 mmol/L (ref 98–111)
Creatinine, Ser: 0.9 mg/dL (ref 0.44–1.00)
Glucose, Bld: 98 mg/dL (ref 70–99)
HCT: 41 % (ref 36.0–46.0)
Hemoglobin: 13.9 g/dL (ref 12.0–15.0)
Potassium: 3.9 mmol/L (ref 3.5–5.1)
Sodium: 138 mmol/L (ref 135–145)
TCO2: 25 mmol/L (ref 22–32)

## 2024-01-10 LAB — BASIC METABOLIC PANEL WITH GFR
Anion gap: 11 (ref 5–15)
BUN: 17 mg/dL (ref 6–20)
CO2: 25 mmol/L (ref 22–32)
Calcium: 9.2 mg/dL (ref 8.9–10.3)
Chloride: 101 mmol/L (ref 98–111)
Creatinine, Ser: 0.88 mg/dL (ref 0.44–1.00)
GFR, Estimated: >60 mL/min (ref >60)
Glucose, Bld: 94 mg/dL (ref 70–99)
Potassium: 3.8 mmol/L (ref 3.5–5.1)
Sodium: 137 mmol/L (ref 135–145)

## 2024-01-10 LAB — POCT PREGNANCY, URINE: Preg Test, Ur: NEGATIVE

## 2024-01-10 SURGERY — REMOVAL, HARDWARE
Anesthesia: General | Laterality: Right

## 2024-01-10 MED ORDER — DEXAMETHASONE SODIUM PHOSPHATE 10 MG/ML IJ SOLN
INTRAMUSCULAR | Status: DC | PRN
  Administered 2024-01-10: 5 mg via INTRAVENOUS

## 2024-01-10 MED ORDER — ONDANSETRON HCL 4 MG/2ML IJ SOLN
INTRAMUSCULAR | Status: DC | PRN
  Administered 2024-01-10: 4 mg via INTRAVENOUS

## 2024-01-10 MED ORDER — BUPIVACAINE-EPINEPHRINE (PF) 0.25% -1:200000 IJ SOLN
INTRAMUSCULAR | Status: DC | PRN
  Administered 2024-01-10: 7 mL

## 2024-01-10 MED ORDER — OXYCODONE HCL 5 MG/5ML PO SOLN: 5.0000 mg | Freq: Once | ORAL | Status: DC | PRN

## 2024-01-10 MED ORDER — HYDROMORPHONE HCL 1 MG/ML IJ SOLN: 0.2500 mg | INTRAMUSCULAR | Status: DC | PRN

## 2024-01-10 MED ORDER — FENTANYL CITRATE (PF) 250 MCG/5ML IJ SOLN
INTRAMUSCULAR | Status: AC
  Filled 2024-01-10: qty 5

## 2024-01-10 MED ORDER — OXYCODONE HCL 5 MG PO TABS: 5.0000 mg | ORAL_TABLET | Freq: Once | ORAL | Status: DC | PRN

## 2024-01-10 MED ORDER — PROPOFOL 10 MG/ML IV BOLUS
INTRAVENOUS | Status: AC
Start: 2024-01-10 — End: 2024-01-10
  Filled 2024-01-10: qty 20

## 2024-01-10 MED ORDER — ONDANSETRON HCL 4 MG/2ML IJ SOLN
INTRAMUSCULAR | Status: AC
  Filled 2024-01-10: qty 2

## 2024-01-10 MED ORDER — FENTANYL CITRATE (PF) 250 MCG/5ML IJ SOLN
INTRAMUSCULAR | Status: DC | PRN
  Administered 2024-01-10: 25 ug via INTRAVENOUS

## 2024-01-10 MED ORDER — AMISULPRIDE (ANTIEMETIC) 5 MG/2ML IV SOLN: 10.0000 mg | Freq: Once | INTRAVENOUS | Status: DC | PRN

## 2024-01-10 MED ORDER — LACTATED RINGERS IV SOLN: INTRAVENOUS | Status: DC

## 2024-01-10 MED ORDER — MIDAZOLAM HCL 2 MG/2ML IJ SOLN
INTRAMUSCULAR | Status: DC | PRN
  Administered 2024-01-10: 2 mg via INTRAVENOUS

## 2024-01-10 MED ORDER — LIDOCAINE 2% (20 MG/ML) 5 ML SYRINGE
INTRAMUSCULAR | Status: AC
  Filled 2024-01-10: qty 5

## 2024-01-10 MED ORDER — PROPOFOL 10 MG/ML IV BOLUS
INTRAVENOUS | Status: DC | PRN
  Administered 2024-01-10: 200 mg via INTRAVENOUS

## 2024-01-10 MED ORDER — ORAL CARE MOUTH RINSE: 15.0000 mL | Freq: Once | OROMUCOSAL | Status: AC

## 2024-01-10 MED ORDER — LIDOCAINE 2% (20 MG/ML) 5 ML SYRINGE
INTRAMUSCULAR | Status: DC | PRN
  Administered 2024-01-10: 40 mg via INTRAVENOUS

## 2024-01-10 MED ORDER — 0.9 % SODIUM CHLORIDE (POUR BTL) OPTIME
TOPICAL | Status: DC | PRN
  Administered 2024-01-10: 1000 mL

## 2024-01-10 MED ORDER — PHENYLEPHRINE 80 MCG/ML (10ML) SYRINGE FOR IV PUSH (FOR BLOOD PRESSURE SUPPORT)
PREFILLED_SYRINGE | INTRAVENOUS | Status: AC
Start: 2024-01-10 — End: 2024-01-10
  Filled 2024-01-10: qty 10

## 2024-01-10 MED ORDER — MIDAZOLAM HCL 2 MG/2ML IJ SOLN
INTRAMUSCULAR | Status: AC
Start: 2024-01-10 — End: 2024-01-10
  Filled 2024-01-10: qty 2

## 2024-01-10 MED ORDER — BUPIVACAINE-EPINEPHRINE (PF) 0.25% -1:200000 IJ SOLN
INTRAMUSCULAR | Status: AC
  Filled 2024-01-10: qty 30

## 2024-01-10 MED ORDER — CHLORHEXIDINE GLUCONATE 0.12 % MT SOLN
15.0000 mL | Freq: Once | OROMUCOSAL | Status: AC
  Administered 2024-01-10: 15 mL via OROMUCOSAL
  Filled 2024-01-10: qty 15

## 2024-01-10 MED ORDER — DEXAMETHASONE SODIUM PHOSPHATE 10 MG/ML IJ SOLN
INTRAMUSCULAR | Status: AC
  Filled 2024-01-10: qty 1

## 2024-01-10 MED ORDER — SODIUM CHLORIDE 0.9 % IV SOLN: 12.5000 mg | INTRAVENOUS | Status: DC | PRN

## 2024-01-10 MED ORDER — MEPERIDINE HCL 25 MG/ML IJ SOLN: 6.2500 mg | INTRAMUSCULAR | Status: DC | PRN

## 2024-01-10 MED ORDER — VANCOMYCIN HCL 1500 MG/300ML IV SOLN
1500.0000 mg | INTRAVENOUS | Status: AC
  Administered 2024-01-10: 1500 mg via INTRAVENOUS
  Filled 2024-01-10: qty 300

## 2024-01-10 MED ORDER — LEVOFLOXACIN IN D5W 500 MG/100ML IV SOLN
500.0000 mg | INTRAVENOUS | Status: AC
  Administered 2024-01-10: 500 mg via INTRAVENOUS
  Filled 2024-01-10: qty 100

## 2024-01-10 MED ORDER — EPHEDRINE SULFATE (PRESSORS) 50 MG/ML IJ SOLN
INTRAMUSCULAR | Status: DC | PRN
  Administered 2024-01-10: 10 mg via INTRAVENOUS

## 2024-01-10 SURGICAL SUPPLY — 42 items
BAG COUNTER SPONGE SURGICOUNT (BAG) ×1 IMPLANT
BANDAGE ESMARK 6X9 LF (GAUZE/BANDAGES/DRESSINGS) ×1 IMPLANT
BNDG COHESIVE 4X5 TAN STRL LF (GAUZE/BANDAGES/DRESSINGS) ×1 IMPLANT
BNDG COHESIVE 6X5 TAN ST LF (GAUZE/BANDAGES/DRESSINGS) ×1 IMPLANT
BNDG ELASTIC 4INX 5YD STR LF (GAUZE/BANDAGES/DRESSINGS) IMPLANT
CANISTER SUCTION 3000ML PPV (SUCTIONS) ×1 IMPLANT
CHLORAPREP W/TINT 26 (MISCELLANEOUS) ×1 IMPLANT
COVER SURGICAL LIGHT HANDLE (MISCELLANEOUS) ×1 IMPLANT
CUFF TOURN SGL QUICK 42 (TOURNIQUET CUFF) IMPLANT
CUFF TRNQT CYL 34X4.125X (TOURNIQUET CUFF) ×1 IMPLANT
DRAPE C-ARM 42X72 X-RAY (DRAPES) ×1 IMPLANT
DRAPE U-SHAPE 47X51 STRL (DRAPES) ×1 IMPLANT
DRSG ADAPTIC 3X8 NADH LF (GAUZE/BANDAGES/DRESSINGS) IMPLANT
DRSG MEPITEL 4X7.2 (GAUZE/BANDAGES/DRESSINGS) IMPLANT
ELECTRODE REM PT RTRN 9FT ADLT (ELECTROSURGICAL) ×1 IMPLANT
GAUZE PAD ABD 8X10 STRL (GAUZE/BANDAGES/DRESSINGS) IMPLANT
GAUZE SPONGE 4X4 12PLY STRL (GAUZE/BANDAGES/DRESSINGS) IMPLANT
GLOVE BIO SURGEON STRL SZ8 (GLOVE) ×1 IMPLANT
GLOVE BIOGEL PI IND STRL 8 (GLOVE) ×2 IMPLANT
GLOVE ECLIPSE 8.0 STRL XLNG CF (GLOVE) ×1 IMPLANT
GOWN STRL REUS W/ TWL LRG LVL3 (GOWN DISPOSABLE) ×1 IMPLANT
GOWN STRL REUS W/ TWL XL LVL3 (GOWN DISPOSABLE) ×2 IMPLANT
KIT BASIN OR (CUSTOM PROCEDURE TRAY) ×1 IMPLANT
KIT TURNOVER KIT B (KITS) ×1 IMPLANT
NDL 22X1.5 STRL (OR ONLY) (MISCELLANEOUS) IMPLANT
NEEDLE 22X1.5 STRL (OR ONLY) (MISCELLANEOUS) IMPLANT
NS IRRIG 1000ML POUR BTL (IV SOLUTION) ×1 IMPLANT
PACK ORTHO EXTREMITY (CUSTOM PROCEDURE TRAY) ×1 IMPLANT
PAD ARMBOARD POSITIONER FOAM (MISCELLANEOUS) ×2 IMPLANT
PAD CAST 4YDX4 CTTN HI CHSV (CAST SUPPLIES) ×1 IMPLANT
SOAP 2 % CHG 4 OZ (WOUND CARE) ×1 IMPLANT
SPONGE T-LAP 4X18 ~~LOC~~+RFID (SPONGE) ×1 IMPLANT
STAPLER SKIN PROX 35W (STAPLE) IMPLANT
SUCTION TUBE FRAZIER 10FR DISP (SUCTIONS) ×1 IMPLANT
SUT PROLENE 3 0 PS 2 (SUTURE) ×1 IMPLANT
SUT VIC AB 2-0 CT1 TAPERPNT 27 (SUTURE) ×2 IMPLANT
SUT VIC AB 3-0 PS2 18XBRD (SUTURE) ×1 IMPLANT
SYR CONTROL 10ML LL (SYRINGE) IMPLANT
TOWEL GREEN STERILE (TOWEL DISPOSABLE) ×1 IMPLANT
TOWEL GREEN STERILE FF (TOWEL DISPOSABLE) ×1 IMPLANT
TUBE CONNECTING 12X1/4 (SUCTIONS) ×1 IMPLANT
WATER STERILE IRR 1000ML POUR (IV SOLUTION) ×1 IMPLANT

## 2024-01-10 NOTE — Transfer of Care (Signed)
 Immediate Anesthesia Transfer of Care Note  Patient: Angela Odom  Procedure(s) Performed: REMOVAL, HARDWARE (Right)  Patient Location: PACU  Anesthesia Type:General  Level of Consciousness: awake and alert   Airway & Oxygen Therapy: Patient Spontanous Breathing and Patient connected to face mask oxygen  Post-op Assessment: Report given to RN and Post -op Vital signs reviewed and stable  Post vital signs: Reviewed and stable  Last Vitals:  Vitals Value Taken Time  BP 103/58 01/10/24 10:45  Temp 36.4 C 01/10/24 10:44  Pulse 69 01/10/24 10:50  Resp 17 01/10/24 10:50  SpO2 97 % 01/10/24 10:50  Vitals shown include unfiled device data.  Last Pain:  Vitals:   01/10/24 1044  TempSrc:   PainSc: Asleep         Complications: No notable events documented.

## 2024-01-10 NOTE — Progress Notes (Signed)
 Orthopedic Tech Progress Note Patient Details:  Angela Odom 09-03-1985 [id number]  Ortho Devices Type of Ortho Device: Postop shoe/boot Ortho Device/Splint Location: RLE Ortho Device/Splint Interventions: Ordered, Application   Post Interventions Patient Tolerated: Well  Juliano Mceachin A Aydrien Froman 01/10/2024, 11:19 AM

## 2024-01-10 NOTE — Anesthesia Postprocedure Evaluation (Signed)
 Anesthesia Post Note  Patient: Angela Odom  Procedure(s) Performed: REMOVAL, HARDWARE (Right)     Patient location during evaluation: PACU Anesthesia Type: General Level of consciousness: awake and alert Pain management: pain level controlled Vital Signs Assessment: post-procedure vital signs reviewed and stable Respiratory status: spontaneous breathing, nonlabored ventilation, respiratory function stable and patient connected to nasal cannula oxygen Cardiovascular status: blood pressure returned to baseline and stable Postop Assessment: no apparent nausea or vomiting Anesthetic complications: no   No notable events documented.  Last Vitals:  Vitals:   01/10/24 1130 01/10/24 1145  BP: 122/65 (!) 113/59  Pulse: 65 74  Resp: 12 16  Temp:  36.6 C  SpO2: 94% 94%    Last Pain:  Vitals:   01/10/24 1145  TempSrc:   PainSc: 0-No pain                 Halli Equihua

## 2024-01-10 NOTE — Discharge Instructions (Addendum)
Toni Arthurs, MD [facility]  Please read the following information regarding your care after surgery.  Medications  You only need a prescription for the narcotic pain medicine (ex. oxycodone, Percocet, Norco).  All of the other medicines listed below are available over the counter. ? Aleve 2 pills twice a day for the first 3 days after surgery. ? acetominophen (Tylenol) 650 mg every 4-6 hours as you need for minor to moderate pain   Weight Bearing ? Bear weight only on your operated foot in the post-op shoe.   Cast / Splint / Dressing ? Keep your splint, cast or dressing clean and dry.  Don't put anything (coat hanger, pencil, etc) down inside of it.  If it gets damp, use a hair dryer on the cool setting to dry it.  If it gets soaked, call the office to schedule an appointment for a cast change.   After your dressing, cast or splint is removed; you may shower, but do not soak or scrub the wound.  Allow the water to run over it, and then gently pat it dry.  Swelling It is normal for you to have swelling where you had surgery.  To reduce swelling and pain, keep your toes above your nose for at least 3 days after surgery.  It may be necessary to keep your foot or leg elevated for several weeks.  If it hurts, it should be elevated.  Follow Up Call my office at [phone] when you are discharged from the hospital or surgery center to schedule an appointment to be seen two weeks after surgery.  Call my office at [phone] if you develop a fever >101.5 F, nausea, vomiting, bleeding from the surgical site or severe pain.

## 2024-01-10 NOTE — Op Note (Signed)
 01/10/2024  10:31 AM  PATIENT:  Angela Odom  38 y.o. female  PRE-OPERATIVE DIAGNOSIS:  right foot 1st metatarsal painful hardware  POST-OPERATIVE DIAGNOSIS:  same  Procedure(s):  removal of deep implants right foot 1st MT  SURGEON:  Amada Backer, MD  ASSISTANT: Adelfa Adolph, PA-C  ANESTHESIA:   General, local  EBL:  minimal   TOURNIQUET:   Total Tourniquet Time Documented: Calf (Right) - 13 minutes Total: Calf (Right) - 13 minutes   COMPLICATIONS:  None apparent  DISPOSITION:  Extubated, awake and stable to recovery.  INDICATION FOR PROCEDURE: Female with a past medical history significant for Down syndrome complains of pain at the site of the previous first metatarsal osteotomy.  She has a dorsal plate and screws at the base of the first metatarsal that are prominent.  She has failed nonoperative treatment including activity and shoewear modification.  She presents today for removal of these deep implants.  She and her parents the risks and benefits of the alternative treatment options and elect surgical treatment.  They specifically understand risks of bleeding, infection, nerve damage, blood clots, need for additional surgery, amputation and death.   PROCEDURE IN DETAIL: After preoperative consent was obtained and the correct operative site was identified, the patient was brought to the operating room and placed supine on the operating table.  General anesthesia was induced.  Preoperative antibiotics were administered.  Surgical timeout was taken.  The right lower extremity was prepped and draped in standard sterile fashion with a tourniquet around the calf.  The extremity was elevated, and the tourniquet was inflated to 200 mmHg.  The previous dorsal incision was identified.  It was opened again sharply and dissection was carried down through the subcutaneous tissues to the superficial aspect of the plate.  The plate was freed of all fibrous tissue.  All 4 screws were removed  without difficulty followed by the plate.  The wound was irrigated copiously.  Subcutaneous tissues were anesthetized with Marcaine .  Deep subcutaneous tissues were approximated with 3-0 Vicryl.  Skin incision was closed with 3-0 nylon.  Sterile dressings were applied followed by compression wrap.  The tourniquet was released after application of the dressings.  The patient was awakened from anesthesia and transported to the recovery room in stable condition.   FOLLOW UP PLAN: Weightbearing as tolerated on the right lower extremity.  Follow-up in the office in 2 weeks for suture removal.  No indication for DVT prophylaxis in this ambulatory patient.    Justin Ollis PA-C was present and scrubbed for the duration of the operative case. His assistance was essential in positioning the patient, prepping and draping, gaining and maintaining exposure, performing the operation, closing and dressing the wounds and applying the splint.

## 2024-01-10 NOTE — Anesthesia Procedure Notes (Addendum)
 Procedure Name: LMA Insertion Date/Time: 01/10/2024 10:08 AM  Performed by: Candance Certain, CRNAPre-anesthesia Checklist: Patient identified, Emergency Drugs available, Suction available and Patient being monitored Patient Re-evaluated:Patient Re-evaluated prior to induction Oxygen Delivery Method: Circle System Utilized Preoxygenation: Pre-oxygenation with 100% oxygen Induction Type: IV induction LMA: LMA with gastric port inserted LMA Size: 4.0 Number of attempts: 1 Airway Equipment and Method: Bite block Placement Confirmation: positive ETCO2 Tube secured with: Tape Dental Injury: Teeth and Oropharynx as per pre-operative assessment  Comments: Atraumatic induction/LMA insertion, but LMA 4 was a tight fit. Able to easily advance in to place with a gentle jaw thrust. Dentition and oral mucosa as per preop.

## 2024-01-10 NOTE — H&P (Signed)
 Angela Odom is an 38 y.o. female.   Chief Complaint: right foot pain HPI: 38 y/o female with PMH of Down sydrome c/o pain at the site of hardware from previous foot surgery.  She has failed non op treatment and presents today for removal of the painful deep implants.  Past Medical History:  Diagnosis Date   CAP (community acquired pneumonia) 11/01/2016   Chronic bronchitis (HCC)    gets it most years (11/01/2016)   Conversion disorder dx'd ~ 2008   Down's syndrome    Family history of adverse reaction to anesthesia    mom has Pseudocholinesterase deficiency   Heart murmur    Hypothyroidism    Pneumomediastinum (HCC) 11/01/2016   Pneumonia    almost yearly til age 25; maybe once since (11/01/2016)   PONV (postoperative nausea and vomiting)    Pseudocholinesterase deficiency   Pseudocholinesterase deficiency    Pulmonary embolism (HCC) 01/2016   both lungs (11/01/2016)    Past Surgical History:  Procedure Laterality Date   BUNIONECTOMY Bilateral    EYE MUSCLE SURGERY Bilateral    LAPAROSCOPIC GASTRIC SLEEVE RESECTION N/A 09/19/2021   Procedure: LAPAROSCOPIC GASTRIC SLEEVE RESECTION;  Surgeon: Adalberto Acton, MD;  Location: [facility];  Service: General;  Laterality: N/A;   MYRINGOPLASTY Bilateral    she's had many sets (11/01/2016)   TOE FUSION Right    big toe   TONSILLECTOMY AND ADENOIDECTOMY     UPPER GI ENDOSCOPY N/A 09/19/2021   Procedure: UPPER GI ENDOSCOPY;  Surgeon: Adalberto Acton, MD;  Location: [facility];  Service: General;  Laterality: N/A;    History reviewed. No pertinent family history. Social History:  reports that she has never smoked. She has never used smokeless tobacco. She reports that she does not drink alcohol and does not use drugs.  Allergies:  Allergies  Allergen Reactions   Succinylcholine     Patient has pseudocholinesterase deficiency- took 5 hours to metabolize succ after eye surgery.  (This was 1991)   Ceclor [Cefaclor] Hives   Sulfa  Antibiotics Hives    Medications Prior to Admission  Medication Sig Dispense Refill   Azelastine HCl 137 MCG/SPRAY SOLN Place 1-2 sprays into the nose in the morning and at bedtime.     Calcium Carbonate (CALCIUM 500 PO) Take 500 mg by mouth 2 (two) times daily.     levocetirizine (XYZAL) 5 MG tablet Take 5 mg by mouth every evening.     levothyroxine  (SYNTHROID ) 112 MCG tablet Take 112 mcg by mouth at bedtime.     montelukast (SINGULAIR) 10 MG tablet Take 10 mg by mouth every evening.      No results found for this or any previous visit (from the past 48 hours). No results found.  Review of Systems  no recent f/c/n/v/wt loss  Blood pressure 119/73, pulse 63, temperature 99.2 F (37.3 C), temperature source Oral, resp. rate 20, height 4' 11 (1.499 m), weight 108.9 kg, last menstrual period 12/26/2023, SpO2 93%. Physical Exam  Wn wd woman in nad.  A and O.  EOMI.  Resp unlabored.  Right foot with healed incisions.  No signs of infection.  Pulses in the foot are palpable.  No lymphadenopathy.  Assessment/Plan R foot painful hardware - to the OR today for removal of deep implants.  The risks and benefits of the alternative treatment options have been discussed in detail.  The patient wishes to proceed with surgery and specifically understands risks of bleeding, infection, nerve damage, blood clots,  need for additional surgery, amputation and death.   Amada Backer, MD 01-13-2024, 7:47 AM

## 2024-01-11 ENCOUNTER — Encounter (HOSPITAL_COMMUNITY): Payer: Self-pay | Admitting: Orthopedic Surgery

## 2024-01-28 ENCOUNTER — Encounter: Payer: Self-pay | Admitting: Skilled Nursing Facility1

## 2024-01-28 ENCOUNTER — Encounter: Payer: Medicare HMO | Attending: Surgery | Admitting: Skilled Nursing Facility1

## 2024-01-28 DIAGNOSIS — Z713 Dietary counseling and surveillance: Secondary | ICD-10-CM | POA: Diagnosis not present

## 2024-01-28 DIAGNOSIS — Z6841 Body Mass Index (BMI) 40.0 and over, adult: Secondary | ICD-10-CM | POA: Insufficient documentation

## 2024-01-28 NOTE — Progress Notes (Signed)
 Bariatric Nutrition Follow-Up Visit Medical Nutrition Therapy    NUTRITION ASSESSMENT    Surgery date: 09/19/2021 Surgery type: sleeve gastrectomy  Start weight at [facility]: 273 pounds Weight today: 216.3 pounds   Body Composition Scale 02/06/2022 05/02/2022 08/29/2022 02/20/2023 07/29/2022 01/28/2024  Current Body Weight 232.6 222.8 225.5 230.3 230.3 216.3  Total Body Fat % 46.1 45.1 45.5 45.9 46.1 44.6  Visceral Fat 16 15 15 15 16 15   Fat-Free Mass % 53.8 54.8 54.4 54 53.8 55.3   Total Body Water  % 41.4 41.9 41.7 41.5 41.4 42.1  Muscle-Mass lbs 28.6 28.5 28.4 28.6 28.4 28.2  BMI 47.1 45.1 45.6 46.6 46.6 43.7  Body Fat Displacement               Torso  lbs 66.5 62.3 63.5 65.6 65.8 59.7         Left Leg  lbs 13.3 12.4 12.7 13.1 13.1 11.9         Right Leg  lbs 13.3 12.4 12.7 13.1 13.1 11.9         Left Arm  lbs 6.6 6.2 6.3 6.5 6.5 5.9         Right Arm   lbs 6.6 6.2 6.3 6.5 6.5 5.9   Clinical  Medical hx: Pulmonary emboli, Downs Syndrome, narrow esophagus Medications: reviewed - azelastine nasal spray, montelukast, levocetirizine, levothyroxin Labs: WNL Notable signs/symptoms: none noted Any previous deficiencies? No   Lifestyle & Dietary Hx  Pt states she has been home for 2 weeks from having some plates and screws removed from her foot. Pts father states she has been eating about 750-900 calories using an app with her parents.  Pt states When she is not working she wakes at around 11am and gets more activity when she is working in the fall.    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  24 Hour Recall:  Breakfast 11am: omelet Snack:  Lunch 2-3: greek yogurt Snack:  Dinner 6-7pm: chicken and mashed potatoes Dessert: sugar free jello  Beverages: oatmilk, water , V8 juice tomato or fruit juice, propel, Bai  Estimated daily fluid intake: 52-55 oz Estimated daily protein intake: 80 g Supplements: multi and calcium  Current average weekly physical activity: walking at her  apartment 3 days and 1 set of stairs a week and swimming 2 days a week, wii, [employer]    Post-Op Goals/ Signs/ Symptoms Using straws: no Drinking while eating: no Chewing/swallowing difficulties: no Changes in vision: no Changes to mood/headaches: no Hair loss/changes to skin/nails: some loss to hair Difficulty focusing/concentrating: no Sweating: no Limb weakness: no Dizziness/lightheadedness: no Palpitations: no  Carbonated/caffeinated beverages: no N/V/D/C/Gas: no Abdominal pain: no Dumping syndrome: no    NUTRITION DIAGNOSIS  Overweight/obesity ([place]-3.3) related to past poor dietary habits and physical inactivity as evidenced by completed bariatric surgery and following dietary guidelines for continued weight loss and healthy nutrition status.     NUTRITION INTERVENTION: continued Nutrition counseling (C-1) and education (E-2) to facilitate bariatric surgery goals, including: Fruits were also added, due to patients activity level and increased calorie needs. The importance of consuming adequate calories as well as certain nutrients daily due to the body's need for essential vitamins, minerals, and fats The importance of daily physical activity and to reach a goal of at least 150 minutes of moderate to vigorous physical activity weekly (or as directed by their physician) due to benefits such as increased musculature and improved lab values The importance of intuitive eating specifically learning hunger-satiety  cues and understanding the importance of learning a new body: The importance of mindful eating to avoid grazing behaviors  Encouraged patient to honor their body's internal hunger and fullness cues.  Throughout the day, check in mentally and rate hunger. Stop eating when satisfied not full regardless of how much food is left on the plate.  Get more if still hungry 20-30 minutes later.  The key is to honor satisfaction so throughout the meal, rate fullness factor and stop when  comfortably satisfied not physically full. The key is to honor hunger and fullness without any feelings of guilt or shame.  Pay attention to what the internal cues are, rather than any external factors. This will enhance the confidence you have in listening to your own body and following those internal cues enabling you to increase how often you eat when you are hungry not out of appetite and stop when you are satisfied not full.  Encouraged pt to continue to eat balanced meals inclusive of non starchy vegetables 2 times a day 7 days a week Encouraged pt to choose lean protein sources: limiting beef, pork, sausage, hotdogs, and lunch meat Encourage pt to choose healthy fats such as plant based limiting animal fats Encouraged pt to continue to drink a minium 64 fluid ounces with half being plain water  to satisfy proper hydration Cautioned on such a low calorie count but advised this will be reviewed at next visit as well for micronutrient deficiencies   Handouts Previously Provided Include  Bariatric MyPlate  Learning Style & Readiness for Change Teaching method utilized: Visual & Auditory  Demonstrated degree of understanding via: Teach Back  Readiness Level: Action Barriers to learning/adherence to lifestyle change: none identified   RD's Notes for Next Visit Assess adherence to pt chosen goals   MONITORING & EVALUATION Dietary intake, weekly physical activity, body weight  Next Steps Patient is to follow-up in 6 months

## 2024-04-16 ENCOUNTER — Encounter (HOSPITAL_COMMUNITY): Payer: Self-pay | Admitting: *Deleted

## 2024-06-30 ENCOUNTER — Ambulatory Visit: Admitting: Skilled Nursing Facility1

## 2024-06-30 ENCOUNTER — Encounter (INDEPENDENT_AMBULATORY_CARE_PROVIDER_SITE_OTHER): Payer: Self-pay | Admitting: Otolaryngology

## 2024-06-30 ENCOUNTER — Ambulatory Visit (INDEPENDENT_AMBULATORY_CARE_PROVIDER_SITE_OTHER): Admitting: Otolaryngology

## 2024-06-30 VITALS — HR 80 | Temp 98.5°F | Ht 59.0 in | Wt 223.0 lb

## 2024-06-30 DIAGNOSIS — Q909 Down syndrome, unspecified: Secondary | ICD-10-CM | POA: Insufficient documentation

## 2024-06-30 DIAGNOSIS — H903 Sensorineural hearing loss, bilateral: Secondary | ICD-10-CM | POA: Insufficient documentation

## 2024-06-30 DIAGNOSIS — H6123 Impacted cerumen, bilateral: Secondary | ICD-10-CM

## 2024-06-30 NOTE — Progress Notes (Signed)
 Patient ID: Angela Odom, female   DOB: 10/29/1985, 38 y.o.   MRN: [id number]  Follow up: Hearing loss, cerumen impaction  HPI: The patient is a 38 year old female with Down syndrome who returns today for her follow-up evaluation.  The patient has a history of bilateral high-frequency sensorineural hearing loss.  She also has a history of recurrent cerumen impaction.  The patient currently denies any otalgia or otorrhea.  She denies any recent change in her hearing.  No other ENT, GI, or respiratory issue noted since the last visit.   Exam: General: Communicates without difficulty, well nourished, no acute distress. Downs features. Head: Normocephalic, no evidence injury, no tenderness, facial buttresses intact without stepoff. Eyes: PERRL, EOMI. No scleral icterus, conjunctivae clear. Neuro: CN II exam reveals vision grossly intact. No nystagmus at any point of gaze. Ears: Bilateral cerumen impaction. Nose: External evaluation reveals normal support and skin without lesions. Dorsum is intact. Anterior rhinoscopy reveals healthy pink mucosa over anterior aspect of inferior turbinates and intact septum. No purulence noted. Oral:  Oral cavity and oropharynx are intact, symmetric, without erythema or edema. Mucosa is moist without lesions. Neck: Full range of motion without pain. There is no significant lymphadenopathy. No masses palpable. Thyroid  bed within normal limits to palpation. Parotid glands and submandibular glands equal bilaterally without mass. Trachea is midline. Neuro:  CN 2-12 grossly intact. Gait normal. Vestibular: No nystagmus at any point of gaze. The cerebellar examination is unremarkable.   Procedure: Bilateral cerumen disimpaction.  Anesthesia: None.  Description: Under the operating microscope, the cerumen is carefully removed with a combination of cerumen currette, alligator forceps, and suction catheters. After the cerumen is removed, the TMs are noted to be normal. No mass,  erythema, or lesions. The patient tolerated the procedure well.   Assessment  1.  Bilateral recurrent cerumen impaction.   2.  The patient's ear canals, tympanic membranes, and middle ear spaces are otherwise normal.   3.  Subjectively stable bilateral high frequency sensorineural hearing loss.  4.  Down syndrome.  Plan: 1.  Otomicroscopy with bilateral cerumen disimpaction.  2.  The physical exam findings okay are reviewed with the patient.  3.  The patient will return for re-evaluation in 6 months.

## 2024-07-29 ENCOUNTER — Encounter: Payer: Self-pay | Admitting: Skilled Nursing Facility1

## 2024-07-29 ENCOUNTER — Encounter: Attending: Surgery | Admitting: Skilled Nursing Facility1

## 2024-07-29 VITALS — Ht 59.0 in | Wt 216.3 lb

## 2024-07-29 DIAGNOSIS — E669 Obesity, unspecified: Secondary | ICD-10-CM | POA: Insufficient documentation

## 2024-07-29 NOTE — Progress Notes (Signed)
 Bariatric Nutrition Follow-Up Visit Medical Nutrition Therapy   Apt time: 2:24 end time: 2:56  NUTRITION ASSESSMENT    Surgery date: 09/19/2021 Surgery type: sleeve gastrectomy  Start weight at [facility]: 273 pounds Weight today: 216.3 pounds   Body Composition Scale 08/29/2022 02/20/2023 07/29/2022 01/28/2024 07/30/2023  Current Body Weight 225.5 230.3 230.3 216.3 216.3  Total Body Fat % 45.5 45.9 46.1 44.6 44.6  Visceral Fat 15 15 16 15 15   Fat-Free Mass % 54.4 54 53.8 55.3 55.3   Total Body Water  % 41.7 41.5 41.4 42.1 42.1  Muscle-Mass lbs 28.4 28.6 28.4 28.2 28.1  BMI 45.6 46.6 46.6 43.7 43.7  Body Fat Displacement              Torso  lbs 63.5 65.6 65.8 59.7 59.8         Left Leg  lbs 12.7 13.1 13.1 11.9 11.9         Right Leg  lbs 12.7 13.1 13.1 11.9 11.9         Left Arm  lbs 6.3 6.5 6.5 5.9 5.9         Right Arm   lbs 6.3 6.5 6.5 5.9 5.9   Clinical  Medical hx: Pulmonary emboli, Downs Syndrome, narrow esophagus Medications: reviewed - azelastine nasal spray, montelukast, levocetirizine, levothyroxin Labs: WNL Notable signs/symptoms: none noted Any previous deficiencies? No   Lifestyle & Dietary Hx  Pts father wonders about weight loss medications: Dietitian educated pt and her father about the GLP's on the market today   Pts father states she sometimes has larger portions in the week but not daily stating he feels she eats very well with no concerns there.  Pt states she loves dancing but doe snot often just dance in her apartment. Pt states she has been with [place] her boyfriend for about 10 years.   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  24 Hour Recall:  Breakfast 11am: omelet with spinach + fruit or wrap: mayo, spinach, chicken, cheese + fruit Snack:  Lunch 2-3: greek yogurt + fruit Snack:  Dinner 6-7pm: chicken and mashed potatoes or eaten out [employer]  Dessert: sugar free jello  Beverages: oatmilk, water , V8 juice tomato or fruit juice, propel, Bai  Estimated  daily fluid intake: 52-55 oz Estimated daily protein intake: 80 g Supplements: multi and calcium  Current average weekly physical activity: swimming Tuesday    Post-Op Goals/ Signs/ Symptoms Using straws: no Drinking while eating: no Chewing/swallowing difficulties: no Changes in vision: no Changes to mood/headaches: no Hair loss/changes to skin/nails: some loss to hair Difficulty focusing/concentrating: no Sweating: no Limb weakness: no Dizziness/lightheadedness: no Palpitations: no  Carbonated/caffeinated beverages: no N/V/D/C/Gas: no Abdominal pain: no Dumping syndrome: no    NUTRITION DIAGNOSIS  Overweight/obesity ([place]-3.3) related to past poor dietary habits and physical inactivity as evidenced by completed bariatric surgery and following dietary guidelines for continued weight loss and healthy nutrition status.     NUTRITION INTERVENTION: continued Nutrition counseling (C-1) and education (E-2) to facilitate bariatric surgery goals, including: continued Fruits were also added, due to patients activity level and increased calorie needs. The importance of consuming adequate calories as well as certain nutrients daily due to the body's need for essential vitamins, minerals, and fats The importance of daily physical activity and to reach a goal of at least 150 minutes of moderate to vigorous physical activity weekly (or as directed by their physician) due to benefits such as increased musculature and improved lab values  The importance of intuitive eating specifically learning hunger-satiety cues and understanding the importance of learning a new body: The importance of mindful eating to avoid grazing behaviors  Encouraged patient to honor their body's internal hunger and fullness cues.  Throughout the day, check in mentally and rate hunger. Stop eating when satisfied not full regardless of how much food is left on the plate.  Get more if still hungry 20-30 minutes later.  The key is  to honor satisfaction so throughout the meal, rate fullness factor and stop when comfortably satisfied not physically full. The key is to honor hunger and fullness without any feelings of guilt or shame.  Pay attention to what the internal cues are, rather than any external factors. This will enhance the confidence you have in listening to your own body and following those internal cues enabling you to increase how often you eat when you are hungry not out of appetite and stop when you are satisfied not full.  Encouraged pt to continue to eat balanced meals inclusive of non starchy vegetables 2 times a day 7 days a week Encouraged pt to choose lean protein sources: limiting beef, pork, sausage, hotdogs, and lunch meat Encourage pt to choose healthy fats such as plant based limiting animal fats Encouraged pt to continue to drink a minium 64 fluid ounces with half being plain water  to satisfy proper hydration Cautioned on such a low calorie count but advised this will be reviewed at next visit as well for micronutrient deficiencies   Handouts Previously Provided Include  Bariatric MyPlate  Learning Style & Readiness for Change Teaching method utilized: Visual & Auditory  Demonstrated degree of understanding via: Teach Back  Readiness Level: Action Barriers to learning/adherence to lifestyle change: none identified   Goal: Aim to do your stairs 10 times 5 days a week, be sure to take breathing breaks when you get out of breath Continue swimming every Monday   RD's Notes for Next Visit Assess adherence to pt chosen goals   MONITORING & EVALUATION Dietary intake, weekly physical activity, body weight  Next Steps Patient is to follow-up in 6 months

## 2024-12-29 ENCOUNTER — Ambulatory Visit (INDEPENDENT_AMBULATORY_CARE_PROVIDER_SITE_OTHER): Admitting: Otolaryngology

## 2025-02-16 ENCOUNTER — Encounter: Admitting: Skilled Nursing Facility1
